# Patient Record
Sex: Male | Born: 1945 | Race: White | Hispanic: No | Smoking: Former smoker
Health system: Southern US, Community
[De-identification: ages and names within clinical notes are randomized; demographics above are authoritative.]

## PROBLEM LIST (undated history)

## (undated) DIAGNOSIS — I10 Essential (primary) hypertension: Secondary | ICD-10-CM

## (undated) DIAGNOSIS — F419 Anxiety disorder, unspecified: Secondary | ICD-10-CM

## (undated) DIAGNOSIS — Z9109 Other allergy status, other than to drugs and biological substances: Secondary | ICD-10-CM

## (undated) DIAGNOSIS — T7840XA Allergy, unspecified, initial encounter: Secondary | ICD-10-CM

## (undated) DIAGNOSIS — E7800 Pure hypercholesterolemia, unspecified: Secondary | ICD-10-CM

## (undated) DIAGNOSIS — Z86010 Personal history of colonic polyps: Secondary | ICD-10-CM

## (undated) DIAGNOSIS — J439 Emphysema, unspecified: Secondary | ICD-10-CM

## (undated) HISTORY — DX: Essential (primary) hypertension: I10

## (undated) HISTORY — DX: Allergy, unspecified, initial encounter: T78.40XA

## (undated) HISTORY — DX: Pure hypercholesterolemia, unspecified: E78.00

## (undated) HISTORY — DX: Emphysema, unspecified: J43.9

## (undated) HISTORY — PX: DOPPLER ECHOCARDIOGRAPHY: SHX263

## (undated) HISTORY — DX: Other allergy status, other than to drugs and biological substances: Z91.09

## (undated) HISTORY — DX: Personal history of colonic polyps: Z86.010

---

## 2007-07-22 ENCOUNTER — Encounter (INDEPENDENT_AMBULATORY_CARE_PROVIDER_SITE_OTHER): Payer: Self-pay | Admitting: General Surgery

## 2007-07-22 ENCOUNTER — Ambulatory Visit (HOSPITAL_COMMUNITY): Admission: RE | Admit: 2007-07-22 | Discharge: 2007-07-23 | Payer: Self-pay | Admitting: General Surgery

## 2008-04-15 HISTORY — PX: CHOLECYSTECTOMY: SHX55

## 2008-06-24 ENCOUNTER — Ambulatory Visit: Payer: Self-pay | Admitting: Internal Medicine

## 2008-07-12 ENCOUNTER — Encounter: Payer: Self-pay | Admitting: Internal Medicine

## 2008-07-12 ENCOUNTER — Ambulatory Visit: Payer: Self-pay | Admitting: Internal Medicine

## 2008-07-12 DIAGNOSIS — Z860100 Personal history of colon polyps, unspecified: Secondary | ICD-10-CM | POA: Insufficient documentation

## 2008-07-12 HISTORY — DX: Personal history of colonic polyps: Z86.010

## 2008-07-14 ENCOUNTER — Encounter: Payer: Self-pay | Admitting: Internal Medicine

## 2009-03-02 IMAGING — RF DG CHOLANGIOGRAM OPERATIVE
1 series · 23 of 24 positions shown · non-contrast
Comparison: None

CLINICAL DATA: Gallstones.

INTRAOPERATIVE CHOLANGIOGRAM
TECHNIQUE: Multiple fluoroscopic spot radiographs were obtained
during intraoperative cholangiogram and are submitted for
interpretation post-operatively.

[Series 1: run · 23 of 67 slices shown]
[im 1/67]
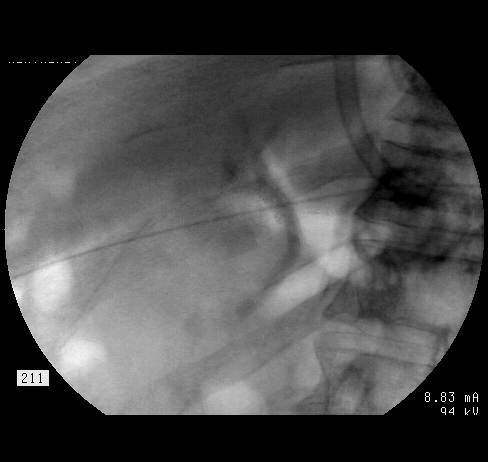
[im 3/67]
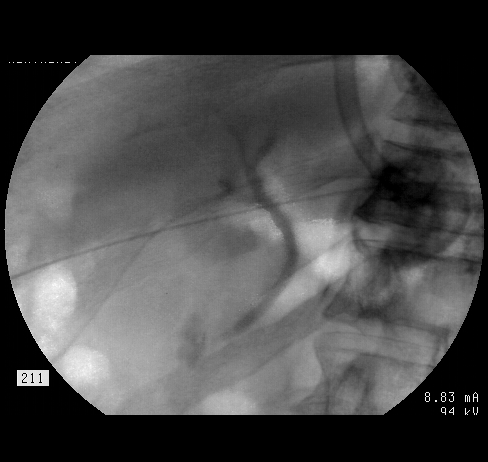
[im 6/67]
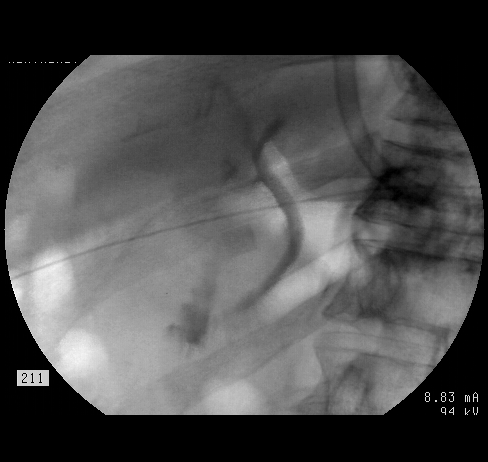
[im 9/67]
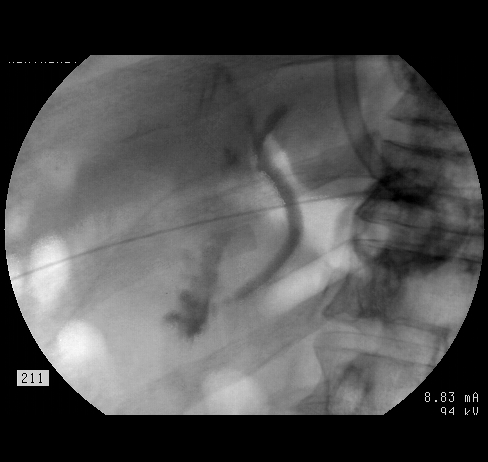
[im 12/67]
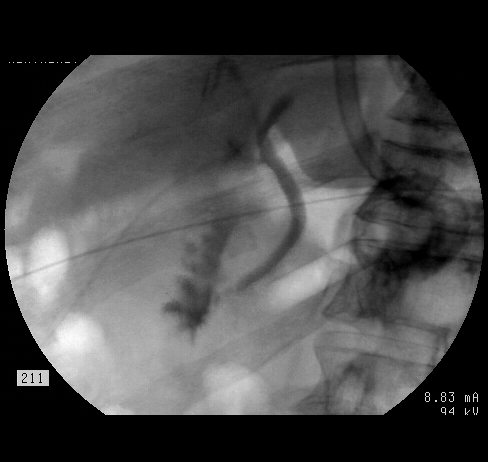
[im 15/67]
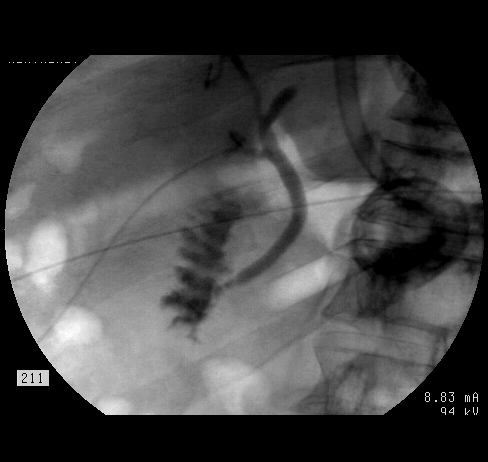
[im 18/67]
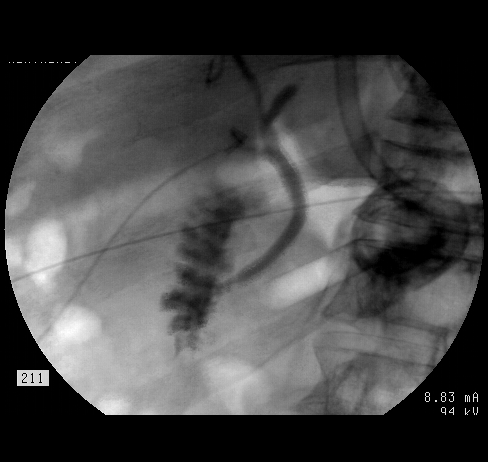
[im 21/67]
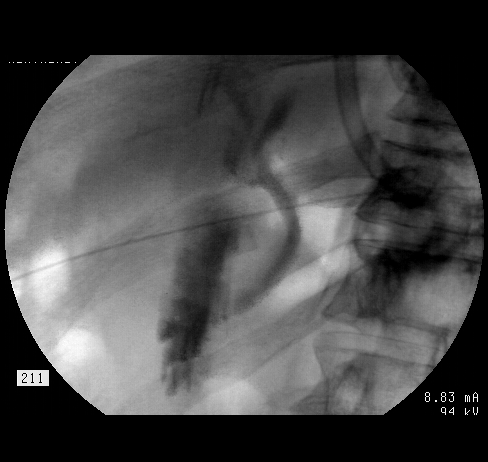
[im 23/67]
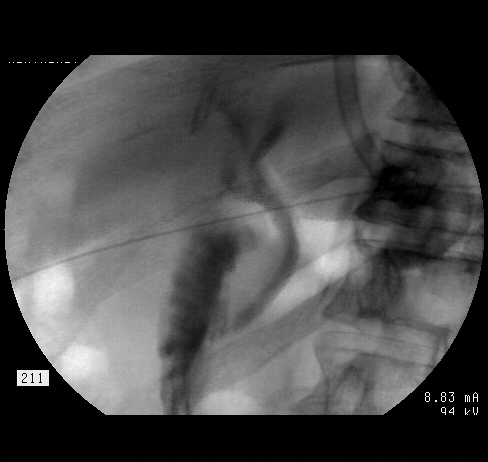
[im 26/67]
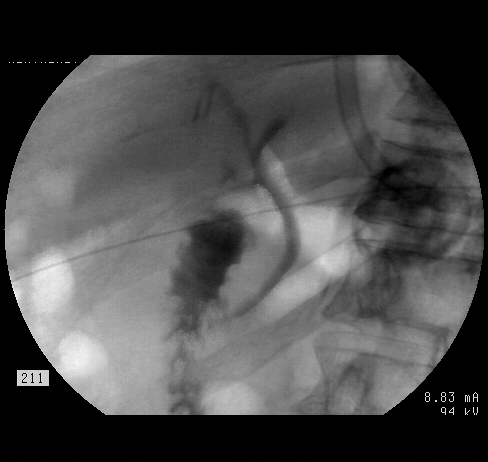
[im 29/67]
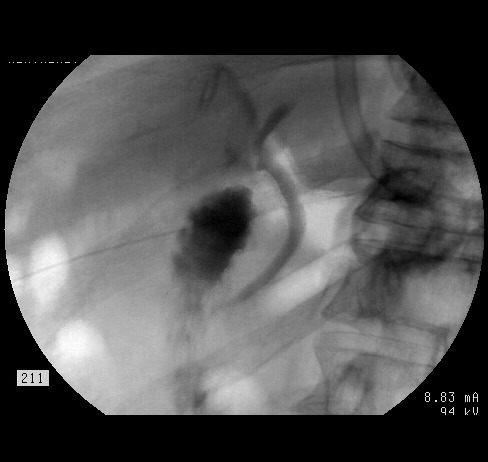
[im 35/67]
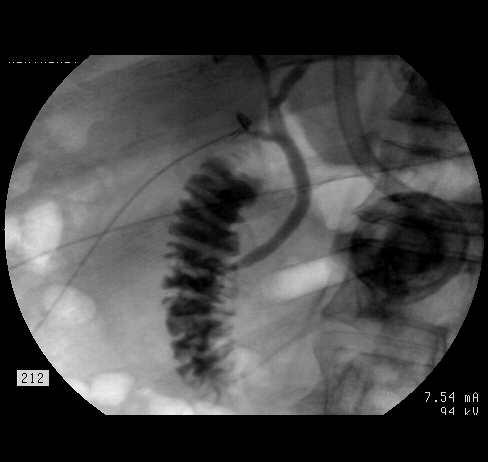
[im 38/67]
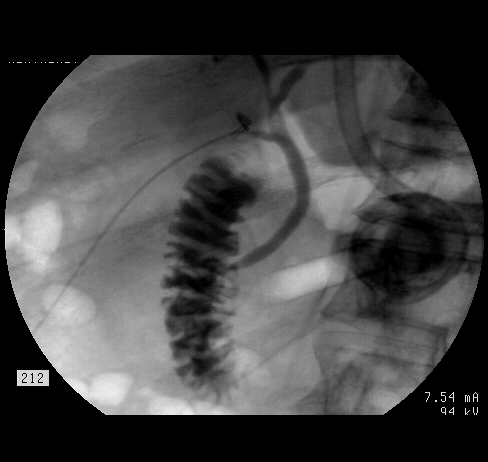
[im 41/67]
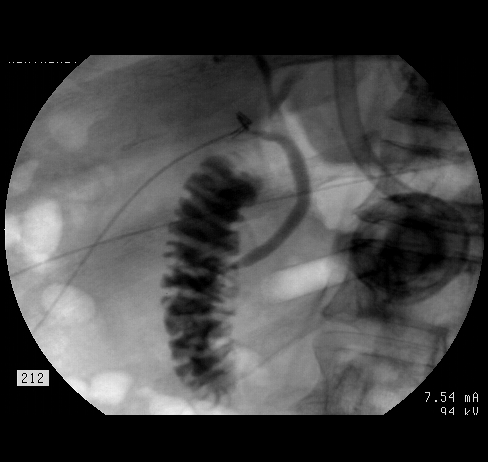
[im 44/67]
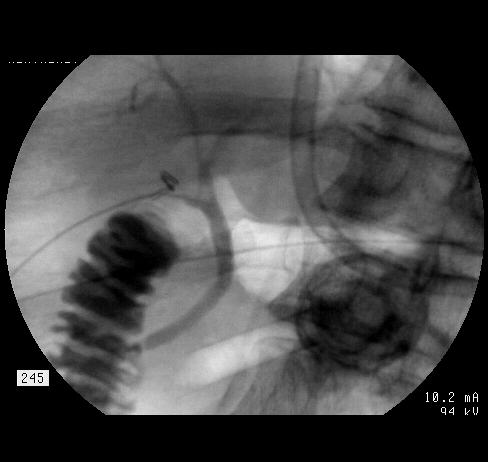
[im 46/67]
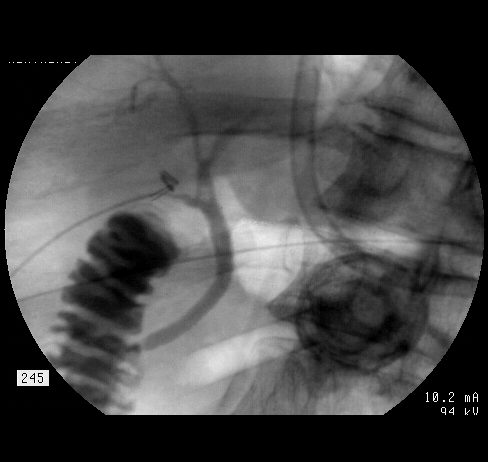
[im 49/67]
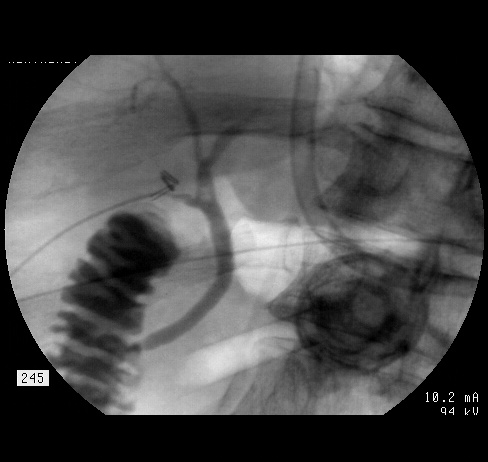
[im 52/67]
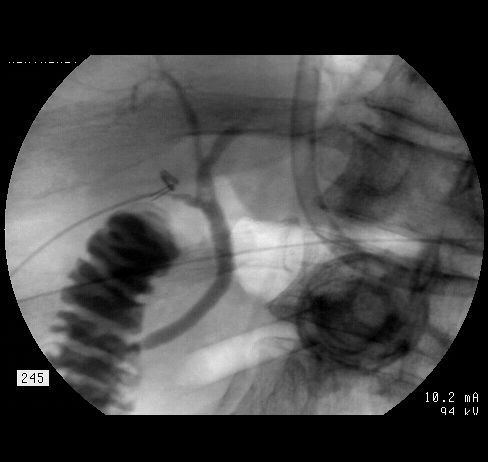
[im 55/67]
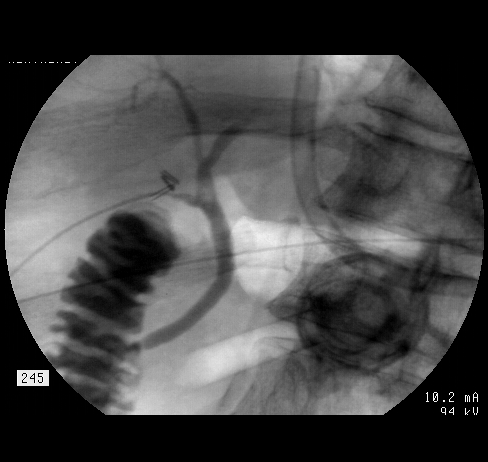
[im 58/67]
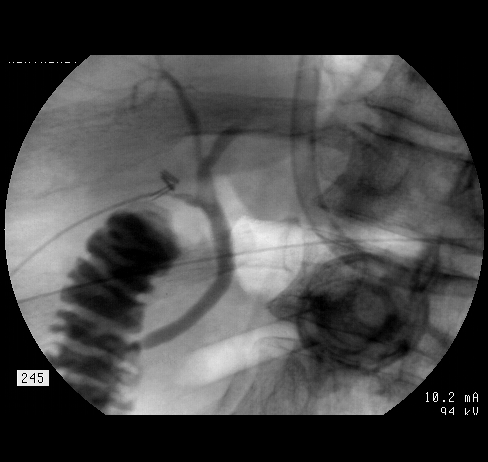
[im 61/67]
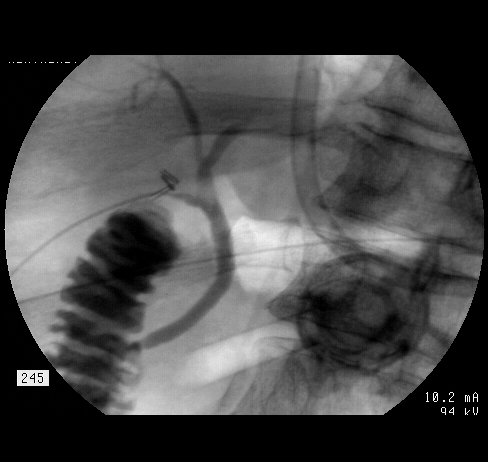
[im 64/67]
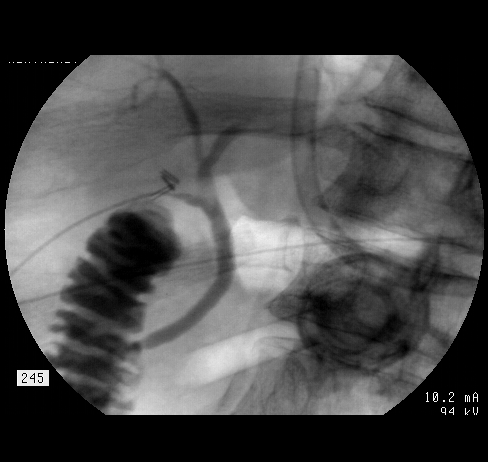
[im 67/67]
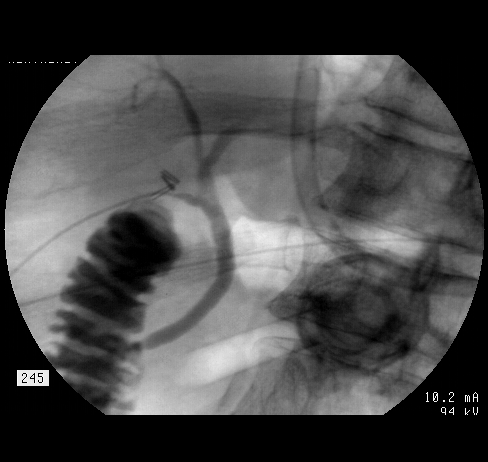

[23 of 24 positions shown; findings below may reference images not displayed]

FINDINGS: A single run of 67 images is submitted.  The exam is
moderate motion limited.  There is no evidence of contrast
extravasation.  Contrast empties into the descending duodenum.
Filling defects within the distal common hepatic duct just superior
to the cystic duct.  Favor these being air bubbles, given the
change in morphology on the final five images of study.
IMPRESSION: 1.  No evidence of contrast extravasation.
2.  Favor gas bubbles in the distal common hepatic duct.  If
bilirubin and becomes elevated, further evaluation with MRCP or
ERCP should be considered to exclude common duct stone.
3.  Moderately motion degraded exam.

## 2010-08-28 NOTE — Op Note (Signed)
NAME:  Joseph Green, Joseph Green                ACCOUNT NO.:  [id number]   MEDICAL RECORD NO.:  [id number]          PATIENT TYPE:  AMB   LOCATION:  DAY                          FACILITY:  [facility]   PHYSICIAN:  Anselm Pancoast. Weatherly, M.D.DATE OF BIRTH:  01-30-1946   DATE OF PROCEDURE:  07/22/2007  DATE OF DISCHARGE:                               OPERATIVE REPORT   PREOPERATIVE DIAGNOSES:  Chronic cholecystitis with stones.   POSTOPERATIVE DIAGNOSES:  Subacute cholecystitis with stones.   OPERATION:  Laparoscopic cholecystectomy with cholangiogram.   ANESTHESIA:  General.   HISTORY:  Joseph Green is a 65 year old slightly overweight Caucasian  male who was referred to me with problems with intermittent epigastric  pain, felt secondary to gallstones.  He had, had an ultrasound that had  confirmed stones.  Dr. Guerry Bruin is his physician, but he was  referred actually to Dr. Louanna Raw, who had seen him when he would  have these episodes.  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  He also is having problems with high  blood pressure, and he was given an appointment to see Dr. Wylene Simmer at  [facility] for management of his blood pressure.  The patient  admitted to only one severe episode of pain, but stated he had some  discomfort.  He does not smoke and has no allergies.  His weight is  about 230 pounds. The patient preoperatively had repeat liver function  studies and an EKG and PTT were normal.  His liver function studies were  normal.  Total bilirubin 0.9, alkaline phosphatase 75, SGOT and SGPT 21  and 18 respectively.  White count was 7200 with a hematocrit of 42.8.   OPERATIVE PROCEDURE:  The patient preoperatively was given 3 gm of Unasyn.  He had PAS  stockings and taken back to the operative suite.  The abdomen after  induction of general anesthesia and endotracheal tube was prepped with  Betadine solution.  We kind of clipped around  the umbilicus in the  subxiphoid area, and then the patient was draped in a sterile manner.  A  small incision was made below the umbilicus,  about 2 cm of adipose  tissue and the fascia identified.  This was kind of carefully opened and  then two Kochers were placed on the fascia edges and very carefully  entered into the peritoneal cavity with the [employer].  The pursestring  suture of Vicryl was placed and the Hassan cannula introduced.  The  gallbladder was subacutely inflamed, it was quite edematous with omentum  adherent to it.  The upper 10 mm trocar was placed in the subxiphoid  area, and the patient was placed in a pretty steep reversed position and  switched over to the right.  Five mm trocars were placed after  anesthetizing the fascia, and the omentum was peeled off of the inflamed  gallbladder.  You really could not grasp the gallbladder with the  graspers because it was so tense and edematous, and I went ahead and  aspirated the bile, but the __________  was not aspirated and the bile  was not very bilious.  Obviously, his gallbladder has not been working.  With the gallbladder depressed, I could then grasp it with the grasper  and then dissecting in the proximal portion, it was obvious that we were  going to need the 30 degree scope, and we switched for the 30 degree  scope.  We then carefully opened the peritoneum.  At the proximal  portion of the gallbladder, you could see a large stone impacted in the  neck of the gallbladder.  And then we kind of carefully teased and freed  up everything, separating the sentinel lymph node.  Then I could get  around the cystic duct that was about 2-2.5 cm in length.  I then placed  a clip flush with the junction of the cystic duct/gallbladder and then  made a little opening in the cystic duct proximally and introduced a  Cook catheter that was held in place with a clip, and then an x-ray was  obtained.  There was good prompt fill of the  extrahepatic biliary system  with good flow into the duodenum.  It looked like there was possibly a  little air bubble that had gone up into the hepatic duct.  We placed him  in a real steep Trendelenburg and did a repeat injection, and we could  see that it looked like there were two little air bubbles that kind of  would change their characteristics.  Later, it was identified that he  had two large stones in his gallbladder, so I think that the little  question of the bile duct on the cholangiogram was most likely a small  air bubble.  We placed him back in an operative position, reinflated the  carbon dioxide and then removed catheter and put three clips on the  cystic duct.  I then divided it and then very carefully teased out and  identified what, I am  sure was the anterior branch of the cystic artery  and also the posterior branch which were individually clipped, divided  and with first __________ and hook and then switching it to the spatula,  the inflamed gallbladder was kind of carefully separated from its bed.  There was several little areas that were lightly coagulated.  Good  hemostasis.  And then after the gallbladder was free, it was placed in  an EndoCatch bag.  The camera was switched to the upper 10 mm port and  the bag containing the gallbladder and stones were withdrawn without  enlarging the fascia any more.  I put an additional figure-of-eight  suture in the fascia below the umbilicus and an additional pursestring  of 0 Vicryl.  I reinspected everything, irrigated, aspirated, and there  was no evidence of any bile or bleeding.  All the bile stained fluid was  withdrawn.  I then withdrew the 5 mL ports under direct vision,  anesthetized all the port sites with Marcaine with adrenaline and then  released the carbon dioxide.  I withdrew the upper 10 mm port.  I did  place a figure-of-eight of 0 Vicryl in the fascia in the subxiphoid area  also.  And then the subcutaneous  wounds were closed with 4-0 Vicryl with  Benzoin and Steri-Strips on the skin.  The patient tolerated the  procedure nicely and should be able to be discharged tomorrow.  He lives  alone and arrangements will be made for his children to picked him up.  ______________________________  Anselm Pancoast. Zachery Dakins, M.D.     WJW/MEDQ  D:  07/22/2007  T:  07/22/2007  Job:  [id number]   cc:   Louanna Raw  Fax: [phone]   Gaspar Garbe, M.D.  Fax: [phone]

## 2011-01-08 LAB — COMPREHENSIVE METABOLIC PANEL
ALT: 18
AST: 21
Albumin: 3.5
Alkaline Phosphatase: 75
BUN: 8
Chloride: 103
GFR calc Af Amer: >60
Potassium: 4.2
Sodium: 139
Total Bilirubin: 0.9
Total Protein: 6.4

## 2011-01-08 LAB — DIFFERENTIAL
Basophils Absolute: 0.1
Lymphocytes Relative: 34
Monocytes Absolute: 0.5
Neutro Abs: 4.1
Neutrophils Relative %: 57

## 2011-01-08 LAB — CBC
Hemoglobin: 15.3
RDW: 14.2

## 2012-05-07 ENCOUNTER — Other Ambulatory Visit: Payer: Self-pay | Admitting: Dermatology

## 2013-07-22 ENCOUNTER — Encounter: Payer: Self-pay | Admitting: Internal Medicine

## 2013-10-18 ENCOUNTER — Encounter: Payer: Self-pay | Admitting: Internal Medicine

## 2013-12-27 ENCOUNTER — Encounter: Payer: Self-pay | Admitting: Internal Medicine

## 2013-12-31 ENCOUNTER — Encounter: Payer: Self-pay | Admitting: Internal Medicine

## 2014-01-12 ENCOUNTER — Encounter: Payer: Self-pay | Admitting: Internal Medicine

## 2014-07-12 ENCOUNTER — Encounter: Payer: Self-pay | Admitting: Internal Medicine

## 2014-09-27 ENCOUNTER — Encounter: Payer: Self-pay | Admitting: Internal Medicine

## 2014-11-10 ENCOUNTER — Encounter: Payer: Self-pay | Admitting: Internal Medicine

## 2015-03-03 ENCOUNTER — Encounter: Payer: Self-pay | Admitting: Internal Medicine

## 2015-04-05 ENCOUNTER — Ambulatory Visit (AMBULATORY_SURGERY_CENTER): Payer: Self-pay

## 2015-04-05 VITALS — Ht 74.0 in | Wt 237.4 lb

## 2015-04-05 DIAGNOSIS — Z860100 Personal history of colon polyps, unspecified: Secondary | ICD-10-CM

## 2015-04-05 NOTE — Progress Notes (Signed)
No allergies to eggs or soy No past problems with anesthesia No home oxygen No diet/weight loss meds  No internet; has had test before

## 2015-04-19 ENCOUNTER — Encounter: Payer: Self-pay | Admitting: Internal Medicine

## 2015-04-19 ENCOUNTER — Ambulatory Visit (AMBULATORY_SURGERY_CENTER): Payer: Medicare Other | Admitting: Internal Medicine

## 2015-04-19 VITALS — BP 136/73 | HR 74 | Temp 98.1°F | Resp 23 | Ht 74.0 in | Wt 237.0 lb

## 2015-04-19 DIAGNOSIS — Z86010 Personal history of colonic polyps: Secondary | ICD-10-CM | POA: Diagnosis present

## 2015-04-19 DIAGNOSIS — D124 Benign neoplasm of descending colon: Secondary | ICD-10-CM | POA: Diagnosis not present

## 2015-04-19 DIAGNOSIS — D123 Benign neoplasm of transverse colon: Secondary | ICD-10-CM

## 2015-04-19 HISTORY — PX: COLONOSCOPY W/ POLYPECTOMY: SHX1380

## 2015-04-19 MED ORDER — SODIUM CHLORIDE 0.9 % IV SOLN: 500.0000 mL | INTRAVENOUS | Status: DC

## 2015-04-19 NOTE — Patient Instructions (Addendum)
I found and removed 3 small polyps. You also have a condition called diverticulosis - common and not usually a problem. Please read the handout provided.   I will let you know pathology results and when to have another routine colonoscopy by mail.  I appreciate the opportunity to care for you. Gatha Mayer, MD, FACG  YOU HAD AN ENDOSCOPIC PROCEDURE TODAY AT [facility]:   Refer to the procedure report that was given to you for any specific questions about what was found during the examination.  If the procedure report does not answer your questions, please call your gastroenterologist to clarify.  If you requested that your care partner not be given the details of your procedure findings, then the procedure report has been included in a sealed envelope for you to review at your convenience later.  YOU SHOULD EXPECT: Some feelings of bloating in the abdomen. Passage of more gas than usual.  Walking can help get rid of the air that was put into your GI tract during the procedure and reduce the bloating. If you had a lower endoscopy (such as a colonoscopy or flexible sigmoidoscopy) you may notice spotting of blood in your stool or on the toilet paper. If you underwent a bowel prep for your procedure, you may not have a normal bowel movement for a few days.  Please Note:  You might notice some irritation and congestion in your nose or some drainage.  This is from the oxygen used during your procedure.  There is no need for concern and it should clear up in a day or so.  SYMPTOMS TO REPORT IMMEDIATELY:   Following lower endoscopy (colonoscopy or flexible sigmoidoscopy):  Excessive amounts of blood in the stool  Significant tenderness or worsening of abdominal pains  Swelling of the abdomen that is new, acute  Fever of 100F or higher   Following upper endoscopy (EGD)  Vomiting of blood or coffee ground material  New chest pain or pain under the shoulder  blades  Painful or persistently difficult swallowing  New shortness of breath  Fever of 100F or higher  Black, tarry-looking stools  For urgent or emergent issues, a gastroenterologist can be reached at any hour by calling [phone].   DIET: Your first meal following the procedure should be a small meal and then it is ok to progress to your normal diet. Heavy or fried foods are harder to digest and may make you feel nauseous or bloated.  Likewise, meals heavy in dairy and vegetables can increase bloating.  Drink plenty of fluids but you should avoid alcoholic beverages for 24 hours.  ACTIVITY:  You should plan to take it easy for the rest of today and you should NOT DRIVE or use heavy machinery until tomorrow (because of the sedation medicines used during the test).    FOLLOW UP: Our staff will call the number listed on your records the next business day following your procedure to check on you and address any questions or concerns that you may have regarding the information given to you following your procedure. If we do not reach you, we will leave a message.  However, if you are feeling well and you are not experiencing any problems, there is no need to return our call.  We will assume that you have returned to your regular daily activities without incident.  If any biopsies were taken you will be contacted by phone or by letter within the next 1-3  weeks.  Please call us at [phone] if you have not heard about the biopsies in 3 weeks.    SIGNATURES/CONFIDENTIALITY: You and/or your care partner have signed paperwork which will be entered into your electronic medical record.  These signatures attest to the fact that that the information above on your After Visit Summary has been reviewed and is understood.  Full responsibility of the confidentiality of this discharge information lies with you and/or your care-partner.  Polyp and diverticulosis information given.

## 2015-04-19 NOTE — Progress Notes (Signed)
Called to room to assist during endoscopic procedure.  Patient ID and intended procedure confirmed with present staff. Received instructions for my participation in the procedure from the performing physician.  

## 2015-04-19 NOTE — Op Note (Signed)
[place]  [address] [place], [id number]   COLONOSCOPY PROCEDURE REPORT  PATIENT: Joseph Green, Joseph Green  MR#: [id number] BIRTHDATE: 06/11/1945 , 69  yrs. old GENDER: male ENDOSCOPIST: Gatha Mayer, MD, [facility] PROCEDURE DATE:  04/19/2015 PROCEDURE:   Colonoscopy, surveillance , Colonoscopy with biopsy, and Colonoscopy with snare polypectomy First Screening Colonoscopy - Avg.  risk and is 50 yrs.  old or older - No.  Prior Negative Screening - Now for repeat screening. N/A  History of Adenoma - Now for follow-up colonoscopy & has been > or = to 3 yrs.  Yes hx of adenoma.  Has been 3 or more years since last colonoscopy.  Polyps removed today? Yes ASA CLASS:   Class III INDICATIONS:Surveillance due to prior colonic neoplasia and PH Colon Adenoma. MEDICATIONS: Propofol 250 mg IV and Monitored anesthesia care  DESCRIPTION OF PROCEDURE:   After the risks benefits and alternatives of the procedure were thoroughly explained, informed consent was obtained.  The digital rectal exam revealed no abnormalities of the rectum and revealed the prostate was not enlarged.   The [id number]  endoscope was introduced through the anus and advanced to the cecum, which was identified by both the appendix and ileocecal valve. No adverse events experienced.   The quality of the prep was adequate  The instrument was then slowly withdrawn as the colon was fully examined. Estimated blood loss is zero unless otherwise noted in this procedure report.  COLON FINDINGS: Three polypoid shaped sessile polyps ranging from 2 to 61mm in size were found in the transverse colon and descending colon.  Polypectomies were performed with cold forceps and with a cold snare.  The resection was complete, the polyp tissue was completely retrieved and sent to histology.   There was diverticulosis noted in the left colon.   The examination was otherwise normal.  Retroflexed views revealed no  abnormalities. The time to cecum = 4.1 Withdrawal time = 13.9   The scope was withdrawn and the procedure completed. COMPLICATIONS: There were no immediate complications.  ENDOSCOPIC IMPRESSION: 1.   Three sessile polyps ranging from 2 to 21mm in size were found in the transverse colon and descending colon; polypectomies were performed with cold forceps and with a cold snare 2.   Diverticulosis was noted in the left colon 3.   The examination was otherwise normal - hx 2 small adenomas 2010  RECOMMENDATIONS: Timing of repeat colonoscopy will be determined by pathology findings.  eSigned:  Gatha Mayer, MD, [facility] 04/19/2015 4:48 PM   cc: Dr. Anastasia Pall and The Patient

## 2015-04-20 ENCOUNTER — Telehealth: Payer: Self-pay

## 2015-04-20 NOTE — Telephone Encounter (Signed)
  Follow up Call-  Call back number 04/19/2015  Post procedure Call Back phone  # [phone]  Permission to leave phone message Yes     Patient questions:  Do you have a fever, pain , or abdominal swelling? No. Pain Score  0 *  Have you tolerated food without any problems? Yes.    Have you been able to return to your normal activities? Yes.    Do you have any questions about your discharge instructions: Diet   No. Medications  No. Follow up visit  No.  Do you have questions or concerns about your Care? No.  Actions: * If pain score is 4 or above: No action needed, pain <4.

## 2015-04-26 ENCOUNTER — Encounter: Payer: Self-pay | Admitting: Internal Medicine

## 2015-04-26 DIAGNOSIS — Z86010 Personal history of colonic polyps: Secondary | ICD-10-CM

## 2015-04-26 NOTE — Progress Notes (Signed)
Quick Note:  3 small adenomas Repeat colonoscopy 2020 ______

## 2015-11-14 ENCOUNTER — Other Ambulatory Visit (INDEPENDENT_AMBULATORY_CARE_PROVIDER_SITE_OTHER): Payer: Medicare Other

## 2015-11-14 ENCOUNTER — Ambulatory Visit (INDEPENDENT_AMBULATORY_CARE_PROVIDER_SITE_OTHER): Payer: Medicare Other | Admitting: Pulmonary Disease

## 2015-11-14 ENCOUNTER — Encounter: Payer: Self-pay | Admitting: Pulmonary Disease

## 2015-11-14 VITALS — BP 136/70 | HR 75 | Ht 74.0 in | Wt 239.6 lb

## 2015-11-14 DIAGNOSIS — J849 Interstitial pulmonary disease, unspecified: Secondary | ICD-10-CM | POA: Diagnosis not present

## 2015-11-14 DIAGNOSIS — J309 Allergic rhinitis, unspecified: Secondary | ICD-10-CM | POA: Insufficient documentation

## 2015-11-14 DIAGNOSIS — J8409 Other alveolar and parieto-alveolar conditions: Secondary | ICD-10-CM

## 2015-11-14 LAB — SEDIMENTATION RATE: Sed Rate: 8 mm/hr (ref 0–20)

## 2015-11-14 NOTE — Assessment & Plan Note (Signed)
Use Neil Med rinses with distilled water at least twice per day using the instructions on the package. 1/2 hour after using the Neil Med rinse, use Nasacort two puffs in each nostril once per day.  Remember that the Nasacort can take 1-2 weeks to work after regular use. Use generic zyrtec (cetirizine) every day.  If this doesn't help, then stop taking it and use chlorpheniramine-phenylephrine combination tablets.   

## 2015-11-14 NOTE — Patient Instructions (Signed)
For your sinus congestion: Use Neil Med rinses with distilled water at least twice per day using the instructions on the package. 1/2 hour after using the [facility] Med rinse, use Nasacort two puffs in each nostril once per day.  Remember that the Nasacort can take 1-2 weeks to work after regular use. Use generic zyrtec (cetirizine) every day.  If this doesn't help, then stop taking it and use chlorpheniramine-phenylephrine combination tablets.  For your abnormal CT scan ("lung scarring"): We will arrange a lung function test and a 6 minute walk We will draw blood today and call you with the results  We will see you back in 6 weeks

## 2015-11-14 NOTE — Assessment & Plan Note (Signed)
I have personally reviewed the images from It from February 2017 high-resolution CT scan as well as from June 2017. Both show groundglass with mild fibrotic changes in an upper lobe predominant distribution, perhaps some mild emphysema in the right upper lobe really nonsignificant. The lower lobes are near-normal.  The differential diagnosis here is broad. This pattern is not consistent with usual interstitial pneumonitis however. Given his lack of symptoms I'm hopeful that this is a burn out form of nonspecific interstitial pneumonitis, or a chronic form of hypersensitivity pneumonitis. The only way to know for certain would be to perform an open lung biopsy but given his lack of shortness of breath I think that's unnecessary right now.   Plan: Given the possibility that this could be a condition which may progress, he needs to be followed with serial pulmonary function testing. We will also perform serology testing to look for evidence of hypersensitivity pneumonitis, less likely vasculitis with his sinus symptoms, and connective tissue disease.

## 2015-11-14 NOTE — Progress Notes (Signed)
Subjective:    Patient ID: Joseph Green, male    DOB: 1945/07/20, 70 y.o.   MRN: [id number]  HPI Chief Complaint  Patient presents with  . Advice Only    referred by abn ct chest, emphysema by Dr. Melford Aase.     Joseph Green was referred from Dr. Melford Aase for an abnormal CT scan of the chest.  He says that he believes that he has emphysema.  He started smoking in 1967 when he was in the service.  He smoked less than 1 pack per day for 41 year, currently smoking 1/2 pack per day.  He notes that he has been feeling sinus congestion for a number of months, this has been going on for several months now.  The sinus congestion will drain down into his throat and make him cough.  This will get better with saline sprays at night.  He does not take any allergy medicine for this.  He doesn't report skin allergies. No itchy eyes, but he will have a scratchy throat associated with worsening congestion.  He has not tried any anti histamine.  It sounds like the congestion and cough lead to a CT scan of his chest which was abnormal.   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  He was often exposed to diesel exhaust in the garage, but he never worked on [employer] directly.  He said that the buses would often run the bus in the garage.  He reports no shortness of breath.  He says that he paces himself in retirement and has a little less energy, but reports no significant dyspnea with work.  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  He notes that his house has had a minor amount of water damage around the chimney which he had repaired when he had his roof replaced about 6 months ago. He does not have a basement in his house. He does  not have any hobbies such as woodworking, his main hobby is bowling. He does not hunt. He has cats that live in the house but he reports no birds or dogs. He does not have a humidifier or a hot tub in the home.  He reports having dry mouth on a daily basis but no dry eyes. He has no arthritis symptoms for redness or swelling of any joints. He reports no trouble swallowing.   Past Medical History:  Diagnosis Date  . Environmental allergies   . Hypercholesteremia   . Hypertension   . Personal history of colonic polyps 07/12/2008     Family History  Problem Relation Age of Onset  . Heart disease Father   . Colon cancer Neg Hx      Social History   Social History  . Marital status: Single    Spouse name: N/A  . Number of children: N/A  . Years of education: N/A   Occupational History  . Not on file.   Social History Main Topics  . Smoking status: Current Every Day Smoker    Packs/day: 1.00    Years: 41.00    Types: Cigarettes  . Smokeless tobacco: Never Used     Comment: has  cut down 0.5ppd Xpast 5 years   . Alcohol use No  . Drug use: No  . Sexual activity: Not on file   Other Topics Concern  . Not on file   Social History Narrative  . No narrative on file     Allergies  Allergen Reactions  . Lipitor [Atorvastatin] Other (See Comments)    Body aches     Outpatient Medications Prior to Visit  Medication Sig Dispense Refill  . amLODipine (NORVASC) 10 MG tablet Take 10 mg by mouth daily.    Marland Kitchen aspirin 81 MG tablet Take 81 mg by mouth daily.    . fluticasone (FLONASE) 50 MCG/ACT nasal spray Place 1 spray into both nostrils daily.     Marland Kitchen OVER THE COUNTER MEDICATION Fish oil 2000mg     . rosuvastatin (CRESTOR) 5 MG tablet Take 5 mg by mouth at bedtime.     No facility-administered medications prior to visit.       Review of Systems  Constitutional: Negative for fever and unexpected weight change.  HENT: Positive for congestion, postnasal drip and sinus  pressure. Negative for dental problem, ear pain, nosebleeds, rhinorrhea, sneezing, sore throat and trouble swallowing.   Eyes: Negative for redness and itching.  Respiratory: Positive for cough and shortness of breath. Negative for chest tightness and wheezing.   Cardiovascular: Negative for palpitations and leg swelling.  Gastrointestinal: Negative for nausea and vomiting.  Genitourinary: Negative for dysuria.  Musculoskeletal: Negative for joint swelling.  Skin: Negative for rash.  Neurological: Negative for headaches.  Hematological: Does not bruise/bleed easily.  Psychiatric/Behavioral: Negative for dysphoric mood. The patient is not nervous/anxious.        Objective:   Physical Exam Vitals:   11/14/15 1636  BP: 136/70  Pulse: 75  SpO2: 97%  Weight: 239 lb 9.6 oz (108.7 kg)  Height: 6\' 2"  (1.88 m)   RA  Gen: well appearing, no acute distress HENT: NCAT, OP clear, neck supple without masses Eyes: PERRL, EOMi Lymph: no cervical lymphadenopathy PULM: CTA B CV: RRR, no mgr, no JVD GI: BS+, soft, nontender, no hsm Derm: no rash or skin breakdown MSK: normal bulk and tone Neuro: A&Ox4, CN II-XII intact, strength 5/5 in all 4 extremities Psyche: normal mood and affect   June 2017 CT chest images personally reviewed showing scattered groundglass and patchy distribution bilaterally, upper lobe predominant, there is some fibrotic change associated with this but no distinct emphysema based on my review.  Spring 2017 records from his primary care physician's office reviewed were he was referred to me for evaluation of his abnormal chest CT.     Assessment & Plan:  ILD (interstitial lung disease) ([place]) I have personally reviewed the images from It from February 2017 high-resolution CT scan as well as from June 2017. Both show groundglass with mild fibrotic changes in an upper lobe predominant distribution, perhaps some mild emphysema in the right upper lobe really  nonsignificant. The lower lobes are near-normal.  The differential diagnosis here is broad. This pattern is not consistent with usual interstitial pneumonitis however. Given his lack of symptoms I'm hopeful that this is a burn out form of nonspecific interstitial pneumonitis, or a chronic form of hypersensitivity pneumonitis. The only way to know for certain would be to perform an open lung biopsy but given his lack of shortness of breath I think that's unnecessary right now.   Plan: Given the possibility that this could be a condition which may progress, he needs to be  followed with serial pulmonary function testing. We will also perform serology testing to look for evidence of hypersensitivity pneumonitis, less likely vasculitis with his sinus symptoms, and connective tissue disease.  Allergic rhinitis Use Neil Med rinses with distilled water at least twice per day using the instructions on the package. 1/2 hour after using the [facility] Med rinse, use Nasacort two puffs in each nostril once per day.  Remember that the Nasacort can take 1-2 weeks to work after regular use. Use generic zyrtec (cetirizine) every day.  If this doesn't help, then stop taking it and use chlorpheniramine-phenylephrine combination tablets.      Current Outpatient Prescriptions:  .  amLODipine (NORVASC) 10 MG tablet, Take 10 mg by mouth daily., Disp: , Rfl:  .  aspirin 81 MG tablet, Take 81 mg by mouth daily., Disp: , Rfl:  .  fluticasone (FLONASE) 50 MCG/ACT nasal spray, Place 1 spray into both nostrils daily. , Disp: , Rfl:  .  OVER THE COUNTER MEDICATION, Fish oil 2000mg , Disp: , Rfl:  .  oxymetazoline (AFRIN) 0.05 % nasal spray, Place 1 spray into both nostrils at bedtime as needed for congestion. Pt takes [facility] brand, Disp: , Rfl:  .  rosuvastatin (CRESTOR) 5 MG tablet, Take 5 mg by mouth at bedtime., Disp: , Rfl:

## 2015-11-15 LAB — ANA: Anti Nuclear Antibody(ANA): NEGATIVE

## 2015-11-15 LAB — CYCLIC CITRUL PEPTIDE ANTIBODY, IGG: Cyclic Citrullin Peptide Ab: <16 Units

## 2015-11-15 LAB — C-REACTIVE PROTEIN: CRP: 0.4 mg/dL — ABNORMAL LOW (ref 0.5–20.0)

## 2015-11-15 LAB — SJOGRENS SYNDROME-A EXTRACTABLE NUCLEAR ANTIBODY: SSA (RO) (ENA) ANTIBODY, IGG: NEGATIVE

## 2015-11-15 LAB — ANTI-SCLERODERMA ANTIBODY: SCLERODERMA (SCL-70) (ENA) ANTIBODY, IGG: NEGATIVE

## 2015-11-15 LAB — RHEUMATOID FACTOR

## 2015-11-15 LAB — SJOGRENS SYNDROME-B EXTRACTABLE NUCLEAR ANTIBODY: SSB (LA) (ENA) ANTIBODY, IGG: NEGATIVE

## 2015-11-15 LAB — CENTROMERE ANTIBODIES: CENTROMERE AB SCREEN: NEGATIVE

## 2015-11-15 LAB — ANCA SCREEN W REFLEX TITER: ANCA Screen: NEGATIVE

## 2015-11-16 LAB — ALDOLASE: ALDOLASE: 6.8 U/L (ref <=8.1)

## 2015-11-17 LAB — HYPERSENSITIVITY PNEUMONITIS
A. PULLULANS ABS: NEGATIVE
A.Fumigatus #1 Abs: NEGATIVE
MICROPOLYSPORA FAENI IGG: NEGATIVE
Pigeon Serum Abs: NEGATIVE
THERMOACTINOMYCES VULGARIS IGG: NEGATIVE
Thermoact. Saccharii: NEGATIVE

## 2015-11-17 LAB — ANTI-JO 1 ANTIBODY, IGG

## 2015-11-20 ENCOUNTER — Telehealth: Payer: Self-pay | Admitting: Pulmonary Disease

## 2015-11-20 NOTE — Telephone Encounter (Signed)
Patient notified of lab results. Nothing further needed.  

## 2015-11-20 NOTE — Progress Notes (Signed)
LMOMTCB x 1 

## 2015-11-23 ENCOUNTER — Ambulatory Visit (HOSPITAL_COMMUNITY)
Admission: RE | Admit: 2015-11-23 | Discharge: 2015-11-23 | Disposition: A | Discharge status: Home / Self Care | Payer: Medicare Other | Source: Ambulatory Visit | Attending: Pulmonary Disease | Admitting: Pulmonary Disease

## 2015-11-23 DIAGNOSIS — J849 Interstitial pulmonary disease, unspecified: Secondary | ICD-10-CM | POA: Insufficient documentation

## 2015-11-23 LAB — PULMONARY FUNCTION TEST
DL/VA % pred: 69 %
DL/VA: 3.33 ml/min/mmHg/L
DLCO unc % pred: 54 %
DLCO unc: 20.45 ml/min/mmHg
FEF 25-75 PRE: 3 L/s
FEF 25-75 Post: 3.97 L/sec
FEF2575-%CHANGE-POST: 32 %
FEF2575-%PRED-PRE: 105 %
FEF2575-%Pred-Post: 138 %
FEV1-%Change-Post: 7 %
FEV1-%PRED-POST: 87 %
FEV1-%Pred-Pre: 82 %
FEV1-PRE: 3.1 L
FEV1-Post: 3.33 L
FEV1FVC-%CHANGE-POST: 0 %
FEV1FVC-%Pred-Pre: 105 %
FEV6-%CHANGE-POST: 6 %
FEV6-%PRED-POST: 86 %
FEV6-%PRED-PRE: 80 %
FEV6-Post: 4.18 L
FEV6-Pre: 3.91 L
FEV6FVC-%CHANGE-POST: 0 %
FEV6FVC-%Pred-Post: 103 %
FEV6FVC-%Pred-Pre: 103 %
FVC-%CHANGE-POST: 6 %
FVC-%PRED-POST: 83 %
FVC-%Pred-Pre: 77 %
FVC-Post: 4.25 L
FVC-Pre: 3.98 L
POST FEV1/FVC RATIO: 78 %
POST FEV6/FVC RATIO: 98 %
PRE FEV1/FVC RATIO: 78 %
Pre FEV6/FVC Ratio: 99 %

## 2015-11-23 MED ORDER — ALBUTEROL SULFATE (2.5 MG/3ML) 0.083% IN NEBU
2.5000 mg | INHALATION_SOLUTION | Freq: Once | RESPIRATORY_TRACT | Status: AC
Start: 2015-11-23 — End: 2015-11-23
  Administered 2015-11-23: 2.5 mg via RESPIRATORY_TRACT

## 2015-12-26 ENCOUNTER — Ambulatory Visit (INDEPENDENT_AMBULATORY_CARE_PROVIDER_SITE_OTHER): Payer: Medicare Other | Admitting: Pulmonary Disease

## 2015-12-26 ENCOUNTER — Encounter: Payer: Self-pay | Admitting: Pulmonary Disease

## 2015-12-26 DIAGNOSIS — R0609 Other forms of dyspnea: Secondary | ICD-10-CM

## 2015-12-26 DIAGNOSIS — J301 Allergic rhinitis due to pollen: Secondary | ICD-10-CM | POA: Diagnosis not present

## 2015-12-26 DIAGNOSIS — J849 Interstitial pulmonary disease, unspecified: Secondary | ICD-10-CM | POA: Diagnosis not present

## 2015-12-26 DIAGNOSIS — Z23 Encounter for immunization: Secondary | ICD-10-CM

## 2015-12-26 NOTE — Assessment & Plan Note (Signed)
Symptoms are greatly improved with nasal steroid and over-the-counter second-generation antihistamine.  I encouraged him to use these in the spring and the summer when his allergic rhinitis is worse.  He's also been using an Afrin-type nasal spray for years. I advised him to try to slowly wean himself off of this and replace it with saline rinses.  > 50% of today's visit spent face to face, 27 minute visit

## 2015-12-26 NOTE — Progress Notes (Signed)
   Subjective:    Patient ID: Joseph Green, male    DOB: 10/17/1945, 70 y.o.   MRN: [id number]  Synopsis: Referred in 2017 for evaluation of an underlying interstitial lung disease. June 2017 CT chest images personally reviewed showing scattered groundglass and patchy distribution bilaterally, upper lobe predominant, there is some fibrotic change associated with this but no distinct emphysema based on my review.  Spring 2017 records from his primary care physician's office reviewed were he was referred to me for evaluation of his abnormal chest CT. 10/2015 aldolase, crp, ana, scl-70, ssa/ssb, ccp, Jo-1, hypersensitivity pneumonitis all negative  August 2017 pulmonary function testing normal ratio, FEV1 3.33 L, 87% predicted, FVC 4.25 L, 83% predicted, DLCO 20.4, 54% predicted, total lung capacity not reported.  September 2017 6 minute walk 396.5 m, O2 saturation nadir 95% on room air  HPI Chief Complaint  Patient presents with  . Follow-up    Review 50mw.  Pt c/o occasional sinus congestion, prod cough, but states he is doing very well.      Joseph Green has been doing well.  His lung function test and his 6 min walk went wel.Marland Kitchen  He reports not having any shortness of breath or any breathing problems at all.  No cough.  He remains quite active.  His sinus symptoms have really improved with the sinus pill and the sinus spray.  Minimal sinus congestion now.  His symptoms are typically worse in the summer. He has used Afrin for years.    Past Medical History:  Diagnosis Date  . Environmental allergies   . Hypercholesteremia   . Hypertension   . Personal history of colonic polyps 07/12/2008      Review of Systems     Objective:   Physical Exam Vitals:   12/26/15 1037  BP: (!) 150/82  Pulse: 92  SpO2: 96%  Weight: 239 lb 9.6 oz (108.7 kg)  Height: 6\' 2"  (1.88 m)   RA  Gen: well appearing HENT: OP clear, TM's clear, neck supple PULM: CTA B, normal percussion CV: RRR, no mgr,  trace edema GI: BS+, soft, nontender Derm: no cyanosis or rash Psyche: normal mood and affect        Assessment & Plan:  No problem-specific Assessment & Plan notes found for this encounter.    Current Outpatient Prescriptions:  .  amLODipine (NORVASC) 10 MG tablet, Take 10 mg by mouth daily., Disp: , Rfl:  .  aspirin 81 MG tablet, Take 81 mg by mouth daily., Disp: , Rfl:  .  fluticasone (FLONASE) 50 MCG/ACT nasal spray, Place 1 spray into both nostrils daily. , Disp: , Rfl:  .  OVER THE COUNTER MEDICATION, Fish oil 2000mg , Disp: , Rfl:  .  oxymetazoline (AFRIN) 0.05 % nasal spray, Place 1 spray into both nostrils at bedtime as needed for congestion. Pt takes [facility] brand, Disp: , Rfl:  .  rosuvastatin (CRESTOR) 5 MG tablet, Take 5 mg by mouth at bedtime., Disp: , Rfl:

## 2015-12-26 NOTE — Patient Instructions (Signed)
We will arrange for a lung function test in 6 minute walk in February and see you after that I recommend that she slowly wean yourself off of the Afrin nasal spray in the evenings and replace it with saline spray You can stop taking your sinus medicines (allergy medicines) after October 31, but then you'll need to resume them in the spring.

## 2015-12-26 NOTE — Assessment & Plan Note (Signed)
It's not clear what form of interstitial lung disease how has, but fortunately it has not caused him any problems in 2017. Also, his lung function testing was essentially normal with the exception of a mild decrease in his diffusion capacity.  His blood work did not yield evidence of an underlying systemic lung disease that would explain these findings.  Again, my hope is that this just represents scarring from an old acute illness. The ulna way to know for certain what's happening here would be to perform an open lung biopsy which is unnecessary considering his lack of symptoms and normal 6 minute walk and pulmonary function testing.  Plan: Repeat 6 minute walk and PFT in February 2018 and follow-up with me, if evidence of worsening then he may need to have a biopsy

## 2016-04-30 ENCOUNTER — Encounter: Payer: Self-pay | Admitting: Pulmonary Disease

## 2016-05-02 ENCOUNTER — Ambulatory Visit: Payer: Medicare Other

## 2016-05-02 ENCOUNTER — Ambulatory Visit: Payer: Medicare Other | Admitting: Pulmonary Disease

## 2016-05-31 NOTE — Addendum Note (Signed)
Addended by: Lorane Gell on: 05/31/2016 05:26 PM   Modules accepted: Orders

## 2016-06-03 ENCOUNTER — Ambulatory Visit (INDEPENDENT_AMBULATORY_CARE_PROVIDER_SITE_OTHER): Payer: Medicare Other | Admitting: Pulmonary Disease

## 2016-06-03 DIAGNOSIS — J849 Interstitial pulmonary disease, unspecified: Secondary | ICD-10-CM

## 2016-06-03 LAB — PULMONARY FUNCTION TEST
DL/VA % pred: 78 %
DL/VA: 3.75 ml/min/mmHg/L
DLCO COR % PRED: 56 %
DLCO UNC: 21.37 ml/min/mmHg
DLCO cor: 21.19 ml/min/mmHg
DLCO unc % pred: 56 %
FEF 25-75 POST: 3.31 L/s
FEF 25-75 Pre: 2.6 L/sec
FEF2575-%Change-Post: 27 %
FEF2575-%PRED-POST: 116 %
FEF2575-%Pred-Pre: 91 %
FEV1-%CHANGE-POST: 4 %
FEV1-%Pred-Post: 79 %
FEV1-%Pred-Pre: 76 %
FEV1-POST: 3 L
FEV1-Pre: 2.86 L
FEV1FVC-%CHANGE-POST: 2 %
FEV1FVC-%PRED-PRE: 106 %
FEV6-%Change-Post: 4 %
FEV6-%Pred-Post: 77 %
FEV6-%Pred-Pre: 74 %
FEV6-Post: 3.77 L
FEV6-Pre: 3.62 L
FEV6FVC-%Change-Post: 1 %
FEV6FVC-%PRED-POST: 105 %
FEV6FVC-%Pred-Pre: 103 %
FVC-%CHANGE-POST: 2 %
FVC-%PRED-PRE: 72 %
FVC-%Pred-Post: 73 %
FVC-POST: 3.77 L
FVC-PRE: 3.67 L
POST FEV1/FVC RATIO: 80 %
PRE FEV1/FVC RATIO: 78 %
PRE FEV6/FVC RATIO: 99 %
Post FEV6/FVC ratio: 100 %

## 2016-06-05 ENCOUNTER — Ambulatory Visit: Payer: Medicare Other | Admitting: Pulmonary Disease

## 2016-06-05 ENCOUNTER — Ambulatory Visit (INDEPENDENT_AMBULATORY_CARE_PROVIDER_SITE_OTHER): Payer: Medicare Other | Admitting: Pulmonary Disease

## 2016-06-05 ENCOUNTER — Encounter: Payer: Self-pay | Admitting: Pulmonary Disease

## 2016-06-05 VITALS — BP 132/72 | HR 75 | Ht 74.0 in | Wt 240.0 lb

## 2016-06-05 DIAGNOSIS — J849 Interstitial pulmonary disease, unspecified: Secondary | ICD-10-CM

## 2016-06-05 DIAGNOSIS — Z720 Tobacco use: Secondary | ICD-10-CM | POA: Diagnosis not present

## 2016-06-05 DIAGNOSIS — F17210 Nicotine dependence, cigarettes, uncomplicated: Secondary | ICD-10-CM | POA: Diagnosis not present

## 2016-06-05 NOTE — Assessment & Plan Note (Signed)
Counseled for 5 mintues today on the importance of quitting smoking. We talked about Chantix, he is not interested in taking this because of some adverse effects he seen in his friends use taken the medication. We did talk about the risks and benefits of taking the medicine but he still not interested. I gave him information regarding community resources for smoking cessation.  Because he smoked one pack of cigarettes daily for 35 years and cut down one half pack 5 years ago he qualifies for lung cancer screening. I will refer him to the lung cancer screening program.

## 2016-06-05 NOTE — Assessment & Plan Note (Signed)
Mr. Talavera has no symptoms of worsening shortness of breath and though he had a slight decline in his forced vital capacity his diffusion capacity in 6 minute walk distance and ambulatory oxygenation have all improved since the last visit. Considering this, I have very little evidence that he has a progressive interstitial lung disease.  While I cannot say exactly what has caused his scarring as seen on the June 2017 CT scan, at this time with a lack of distinct features of usual interstitial pneumonitis I do not feel that he has IPF. We will continue monitoring with serial lung function testing in 6 minute walks.  Plan: Follow-up one year with pulmonary function testing 6 minute walk He is to report to me if he has any respiratory symptoms in the meantime.

## 2016-06-05 NOTE — Progress Notes (Signed)
Subjective:    Patient ID: Joseph Green, male    DOB: 1945/08/13, 71 y.o.   MRN: [id number]  Synopsis: Referred in 2017 for evaluation of an underlying interstitial lung disease.   HPI Chief Complaint  Patient presents with  . Follow-up    review pft and 59mw.  pt states he is doing well, no complaints today.      Joseph Green [place] the beach [place].  His breathing has been OK.  No limitation from his breathing.  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  His sinus drainage has been about the same.     Past Medical History:  Diagnosis Date  . Environmental allergies   . Hypercholesteremia   . Hypertension   . Personal history of colonic polyps 07/12/2008      Review of Systems  Constitutional: Negative for chills, fatigue and fever.  HENT: Positive for postnasal drip. Negative for rhinorrhea, sinus pain and sinus pressure.   Respiratory: Positive for cough. Negative for shortness of breath and wheezing.   Cardiovascular: Negative for chest pain, palpitations and leg swelling.       Objective:   Physical Exam Vitals:   06/05/16 0925  BP: 132/72  Pulse: 75  SpO2: 96%  Weight: 240 lb (108.9 kg)  Height: 6\' 2"  (1.88 m)   RA  Gen: well appearing HENT: OP clear, TM's clear, neck supple PULM: Few crackles  B, normal percussion CV: RRR, no mgr, trace edema GI: BS+, soft, nontender Derm: no cyanosis or rash Psyche: normal mood and affect    Imaging: June 2017 CT chest images personally reviewed showing scattered groundglass and patchy distribution bilaterally, upper lobe predominant, there is some fibrotic change associated with this but no distinct emphysema based on my review.  10/2015 aldolase, crp, ana, scl-70, ssa/ssb, ccp, Jo-1, hypersensitivity pneumonitis all negative  PFT: August 2017 pulmonary function testing normal ratio, FEV1 3.33 L, 87% predicted, FVC 4.25 L,  83% predicted, DLCO 20.4, 54% predicted, total lung capacity not reported. February 2018 pulmonary function testing FVC 3.77 L, 73% predicted, ratio normal DLCO 21.37 56% predicted  6MW: September 2017 6 minute walk 396.5 m, O2 saturation nadir 95% on room air 05/2016 6MW: 436 m O2 saturation nadir 97% RA      Assessment & Plan:  Cigarette smoker Counseled for 5 mintues today on the importance of quitting smoking. We talked about Chantix, he is not interested in taking this because of some adverse effects he seen in his friends use taken the medication. We did talk about the risks and benefits of taking the medicine but he still not interested. I gave him information regarding community resources for smoking cessation.  Because he smoked one pack of cigarettes daily for 35 years and cut down one half pack 5 years ago he qualifies for lung cancer screening. I will refer him to the lung cancer screening program.  ILD (interstitial lung disease) [facility]) Joseph Green has no symptoms of worsening shortness of breath and though he had a slight decline in his forced vital capacity his diffusion capacity in 6 minute walk distance and ambulatory oxygenation have all improved since the last visit. Considering this, I have very little evidence that he has a progressive interstitial lung disease.  While I cannot say exactly what has caused his scarring as seen on the June 2017 CT scan,  at this time with a lack of distinct features of usual interstitial pneumonitis I do not feel that he has IPF. We will continue monitoring with serial lung function testing in 6 minute walks.  Plan: Follow-up one year with pulmonary function testing 6 minute walk He is to report to me if he has any respiratory symptoms in the meantime.  13 minutes spent face to face, 26 minute visit   Current Outpatient Prescriptions:  .  amLODipine (NORVASC) 10 MG tablet, Take 10 mg by mouth daily., Disp: , Rfl:  .  aspirin 81 MG  tablet, Take 81 mg by mouth daily., Disp: , Rfl:  .  fluticasone (FLONASE) 50 MCG/ACT nasal spray, Place 1 spray into both nostrils daily. , Disp: , Rfl:  .  OVER THE COUNTER MEDICATION, Fish oil 2000mg , Disp: , Rfl:  .  oxymetazoline (AFRIN) 0.05 % nasal spray, Place 1 spray into both nostrils at bedtime as needed for congestion. Pt takes [facility] brand, Disp: , Rfl:  .  rosuvastatin (CRESTOR) 5 MG tablet, Take 5 mg by mouth at bedtime., Disp: , Rfl:

## 2016-06-05 NOTE — Patient Instructions (Signed)
Quit smoking Consider attending one of the classes in our community for smoking cessation We will refer you to the lung cancer screening program We will arrange another lung function test in 6 minute walk in one year when you come back

## 2016-06-13 ENCOUNTER — Telehealth: Payer: Self-pay | Admitting: Pulmonary Disease

## 2016-06-13 DIAGNOSIS — F17210 Nicotine dependence, cigarettes, uncomplicated: Secondary | ICD-10-CM

## 2016-06-13 NOTE — Telephone Encounter (Signed)
Pt calling back [phone]

## 2016-06-14 NOTE — Telephone Encounter (Signed)
Spoke with pt and scheduled for [facility] 07/03/16 11:30 CT ordered Nothing further needed

## 2016-07-03 ENCOUNTER — Ambulatory Visit (INDEPENDENT_AMBULATORY_CARE_PROVIDER_SITE_OTHER)
Admission: RE | Admit: 2016-07-03 | Discharge: 2016-07-03 | Disposition: A | Discharge status: Home / Self Care | Payer: Medicare Other | Source: Ambulatory Visit | Attending: Acute Care | Admitting: Acute Care

## 2016-07-03 ENCOUNTER — Ambulatory Visit (INDEPENDENT_AMBULATORY_CARE_PROVIDER_SITE_OTHER): Payer: Medicare Other | Admitting: Acute Care

## 2016-07-03 ENCOUNTER — Encounter: Payer: Self-pay | Admitting: Acute Care

## 2016-07-03 DIAGNOSIS — F17210 Nicotine dependence, cigarettes, uncomplicated: Secondary | ICD-10-CM

## 2016-07-03 DIAGNOSIS — Z87891 Personal history of nicotine dependence: Secondary | ICD-10-CM

## 2016-07-03 NOTE — Progress Notes (Signed)
Shared Decision Making Visit Lung Cancer Screening Program [phone])   Eligibility:  Age 71 y.o.  Pack Years Smoking History Calculation 41-pack-year smoking history (# packs/per year x # years smoked)  Recent History of coughing up blood  no  Unexplained weight loss? no ( >Than 15 pounds within the last 6 months )  Prior History Lung / other cancer no (Diagnosis within the last 5 years already requiring surveillance chest CT Scans).  Smoking Status Current Smoker  Former Smokers: Years since quit:NA  Quit Date: NA  Visit Components:  Discussion included one or more decision making aids. yes  Discussion included risk/benefits of screening. yes  Discussion included potential follow up diagnostic testing for abnormal scans. yes  Discussion included meaning and risk of over diagnosis. yes  Discussion included meaning and risk of False Positives. yes  Discussion included meaning of total radiation exposure. yes  Counseling Included:  Importance of adherence to annual lung cancer LDCT screening. yes  Impact of comorbidities on ability to participate in the program. yes  Ability and willingness to under diagnostic treatment. yes  Smoking Cessation Counseling:  Current Smokers:   Discussed importance of smoking cessation. yes  Information about tobacco cessation classes and interventions provided to patient. yes  Patient provided with "ticket" for LDCT Scan. yes  Symptomatic Patient. No  Counseling  Diagnosis Code: Tobacco Use Z72.0  Asymptomatic Patient yes  Counseling (Intermediate counseling: > three minutes counseling) Z6606  Former Smokers:   Discussed the importance of maintaining cigarette abstinence. yes  Diagnosis Code: Personal History of Nicotine Dependence. T01.601  Information about tobacco cessation classes and interventions provided to patient. Yes  Patient provided with "ticket" for LDCT Scan. yes  Written Order for Lung Cancer  Screening with LDCT placed in Epic. Yes (CT Chest Lung Cancer Screening Low Dose W/O CM) [id number] Z12.2-Screening of respiratory organs Z87.891-Personal history of nicotine dependence   I have spent 25 minutes of face to face time with Mr. Golladay discussing the risks and benefits of lung cancer screening. We viewed a power point together that explained in detail the above noted topics. We paused at intervals to allow for questions to be asked and answered to ensure understanding.We discussed that the single most powerful action that he can take to decrease his risk of developing lung cancer is to quit smoking. We discussed whether or not he is ready to commit to setting a quit date. He is currently not ready to set a quit date We discussed options for tools to aid in quitting smoking including nicotine replacement therapy, non-nicotine medications, support groups, Quit Smart classes, and behavior modification. We discussed that often times setting smaller, more achievable goals, such as eliminating 1 cigarette a day for a week and then 2 cigarettes a day for a week can be helpful in slowly decreasing the number of cigarettes smoked. This allows for a sense of accomplishment as well as providing a clinical benefit. I gave him the " Be Stronger Than Your Excuses" card with contact information for community resources, classes, free nicotine replacement therapy, and access to mobile apps, text messaging, and on-line smoking cessation help. I have also given him my card and contact information in the event he needs to contact me. We discussed the time and location of the scan, and that either Doroteo Glassman RN or I will call with the results within 24-48 hours of receiving them. I have provided Mr. Uecker with a copy of the power point we viewed  as  a resource in the event they need reinforcement of the concepts we discussed today in the office. The patient verbalized understanding of all of  the above and had no  further questions upon leaving the office. They have my contact information in the event they have any further questions.  I spent 3-4 minutes counseling on his need for smoking cessation, and the health risks of continuing to smoke.  I explained that there has been a high incidence of coronary artery disease noted on these exams. Mr. Reep has failed statin therapy in the past. I explained that these scans are non-gated therefore degree or severity of coronary artery disease cannot be determined. I told him we will fax the results of the scan to his primary care provider to allow him to follow-up as he feels is clinically indicated. Mr. Panas verbalized understanding  Magdalen Spatz, NP 07/03/2016

## 2016-07-22 ENCOUNTER — Telehealth: Payer: Self-pay | Admitting: Acute Care

## 2016-07-22 DIAGNOSIS — F17210 Nicotine dependence, cigarettes, uncomplicated: Secondary | ICD-10-CM

## 2016-07-23 NOTE — Telephone Encounter (Signed)
Spoke with pt and informed of CT results per Eric Form, NP.  PT verbalized understanding.  Copy sent to Dr Lake Bells.  Order placed for 1 yr f/u CT.

## 2017-07-01 ENCOUNTER — Other Ambulatory Visit: Payer: Self-pay | Admitting: Pulmonary Disease

## 2017-07-01 DIAGNOSIS — R0600 Dyspnea, unspecified: Secondary | ICD-10-CM

## 2017-07-02 ENCOUNTER — Ambulatory Visit (INDEPENDENT_AMBULATORY_CARE_PROVIDER_SITE_OTHER): Payer: Medicare Other | Admitting: Pulmonary Disease

## 2017-07-02 DIAGNOSIS — R0600 Dyspnea, unspecified: Secondary | ICD-10-CM

## 2017-07-02 LAB — PULMONARY FUNCTION TEST
DL/VA % PRED: 71 %
DL/VA: 3.43 ml/min/mmHg/L
DLCO unc % pred: 53 %
DLCO unc: 20.16 ml/min/mmHg
FEF 25-75 POST: 1.51 L/s
FEF 25-75 PRE: 2.71 L/s
FEF2575-%CHANGE-POST: -44 %
FEF2575-%PRED-PRE: 97 %
FEF2575-%Pred-Post: 54 %
FEV1-%Change-Post: -9 %
FEV1-%PRED-PRE: 84 %
FEV1-%Pred-Post: 76 %
FEV1-PRE: 3.15 L
FEV1-Post: 2.86 L
FEV1FVC-%CHANGE-POST: 0 %
FEV1FVC-%PRED-PRE: 105 %
FEV6-%CHANGE-POST: -9 %
FEV6-%PRED-PRE: 84 %
FEV6-%Pred-Post: 76 %
FEV6-Post: 3.68 L
FEV6-Pre: 4.07 L
FEV6FVC-%Change-Post: 0 %
FEV6FVC-%PRED-POST: 104 %
FEV6FVC-%Pred-Pre: 104 %
FVC-%CHANGE-POST: -9 %
FVC-%PRED-PRE: 81 %
FVC-%Pred-Post: 73 %
FVC-POST: 3.72 L
FVC-PRE: 4.11 L
POST FEV6/FVC RATIO: 99 %
PRE FEV1/FVC RATIO: 77 %
Post FEV1/FVC ratio: 77 %
Pre FEV6/FVC Ratio: 99 %

## 2017-07-02 NOTE — Progress Notes (Signed)
PFT done today. 

## 2017-07-04 ENCOUNTER — Ambulatory Visit (INDEPENDENT_AMBULATORY_CARE_PROVIDER_SITE_OTHER)
Admission: RE | Admit: 2017-07-04 | Discharge: 2017-07-04 | Disposition: A | Discharge status: Home / Self Care | Payer: Medicare Other | Source: Ambulatory Visit | Attending: Acute Care | Admitting: Acute Care

## 2017-07-04 DIAGNOSIS — F17210 Nicotine dependence, cigarettes, uncomplicated: Secondary | ICD-10-CM

## 2017-07-07 ENCOUNTER — Other Ambulatory Visit: Payer: Self-pay | Admitting: Acute Care

## 2017-07-07 DIAGNOSIS — F17210 Nicotine dependence, cigarettes, uncomplicated: Secondary | ICD-10-CM

## 2017-07-07 DIAGNOSIS — Z122 Encounter for screening for malignant neoplasm of respiratory organs: Secondary | ICD-10-CM

## 2017-07-09 ENCOUNTER — Ambulatory Visit (INDEPENDENT_AMBULATORY_CARE_PROVIDER_SITE_OTHER): Payer: Medicare Other | Admitting: *Deleted

## 2017-07-09 ENCOUNTER — Ambulatory Visit: Payer: Medicare Other | Admitting: Pulmonary Disease

## 2017-07-09 ENCOUNTER — Encounter: Payer: Self-pay | Admitting: Pulmonary Disease

## 2017-07-09 VITALS — BP 144/76 | HR 72 | Ht 74.0 in | Wt 241.0 lb

## 2017-07-09 DIAGNOSIS — J849 Interstitial pulmonary disease, unspecified: Secondary | ICD-10-CM

## 2017-07-09 MED ORDER — BUPROPION HCL ER (SR) 150 MG PO TB12: 150.0000 mg | ORAL_TABLET | Freq: Two times a day (BID) | ORAL | 3 refills | Status: DC

## 2017-07-09 MED ORDER — NICOTINE 10 MG IN INHA: 1.0000 | RESPIRATORY_TRACT | 3 refills | Status: DC | PRN

## 2017-07-09 MED ORDER — MONTELUKAST SODIUM 10 MG PO TABS: 10.0000 mg | ORAL_TABLET | Freq: Every day | ORAL | 11 refills | Status: DC

## 2017-07-09 NOTE — Patient Instructions (Signed)
Interstitial lung disease. Today's lung function testing was stable, 6-minute walk was stable We will plan on repeating lung function testing and a 6-minute walk in 1 year  Tobacco abuse: Continue plans for annual CT scans with the lung cancer screening program We will prescribe Wellbutrin 150 mg twice a day We will prescribe Nicotrol inhaler I am glad that you are trying to quit smoking  We will see you back in about 6-8 weeks to see how you are doing with the Wellbutrin

## 2017-07-09 NOTE — Progress Notes (Signed)
SIX MIN WALK 07/09/2017 06/03/2016 12/26/2015  Medications amlodipine 10mg  and aspirin 81mg  taken around 5:30 Amlodipine, ASA 81 Blood pressure meds, fish oil, allergy meds; no inhalers  Supplimental Oxygen during Test? (L/min) No No No  Laps 9 9 8   Partial Lap (in Meters) 0 4 12.5  Baseline BP (sitting) 122/78 136/74 160/96  Baseline Heartrate 69 65 79  Baseline Dyspnea (Borg Scale) 2 0.5 3  Baseline Fatigue (Borg Scale) 1 2 2   Baseline SPO2 97 97 95  BP (sitting) 160/80 146/82 178/92  Heartrate 85 92 96  Dyspnea (Borg Scale) 2 1 3   Fatigue (Borg Scale) 3 1 2   SPO2 97 97 97  BP (sitting) 130/72 140/80 150/82  Heartrate 73 72 92  SPO2 98 97 96  Stopped or Paused before Six Minutes No No No  Distance Completed 432 436 396.5  Tech Comments: Pt walked at a normal to moderate pace denying any complaints during walk. Pt. walked a steady rapid place. He did well on his walk and did not take any breaks. He did not express any dyspnea or pain during or after walk Patient had no complaints but noticed patient started coughing during test

## 2017-07-09 NOTE — Progress Notes (Signed)
Subjective:    Patient ID: Joseph Green, male    DOB: 1945/07/12, 72 y.o.   MRN: [id number]  Synopsis: Referred in 2017 for evaluation of an underlying interstitial lung disease.   HPI Chief Complaint  Patient presents with  . Follow-up    review 76mw.  pt c/o unchanged doe.     Griffen says he has bene active working outside.  He was staying at the beach for the last few weeks and felt OK.  He goes there about once a month.  He feels that his breathing has been doing OK.  He says that sometimes if he is sitting and watching TV he will have a bad coughing spell in the mornings.  He may coiugh up some mucus in th emornings.  He says that when he coughs up mucus it is clear.  He has a lot of sinus congestion and he feels that this is the cause of the symptoms. He has been taking mucinex and allegra.  He has been taking the [facility] brand of fluticasone.    He is still smoking 1/2 pack of cigarettes daily.  He says he is interested in trying medicine to help quit.  Past Medical History:  Diagnosis Date  . Environmental allergies   . Hypercholesteremia   . Hypertension   . Personal history of colonic polyps 07/12/2008      Review of Systems  Constitutional: Negative for chills, fatigue and fever.  HENT: Positive for postnasal drip. Negative for rhinorrhea, sinus pressure and sinus pain.   Respiratory: Positive for cough. Negative for shortness of breath and wheezing.   Cardiovascular: Negative for chest pain, palpitations and leg swelling.       Objective:   Physical Exam Vitals:   07/09/17 1112  BP: (!) 144/76  Pulse: 72  SpO2: 98%  Weight: 241 lb (109.3 kg)  Height: 6\' 2"  (1.88 m)   RA  Gen: well appearing HENT: OP clear, TM's clear, neck supple PULM: CTA B, normal percussion CV: RRR, no mgr, trace edema GI: BS+, soft, nontender Derm: no cyanosis or rash Psyche: normal mood and affect    Imaging: June 2017 CT chest images personally reviewed showing scattered  groundglass and patchy distribution bilaterally, upper lobe predominant, there is some fibrotic change associated with this but no distinct emphysema based on my review. March 2019 lung cancer screening CT images personally reviewed showing once again reticular and interstitial abnormalities and a bronchovascular distribution in the upper lobes in a peripheral distribution in the lower lobes, RADS 2.  10/2015 aldolase, crp, ana, scl-70, ssa/ssb, ccp, Jo-1, hypersensitivity pneumonitis all negative  PFT: August 2017 pulmonary function testing normal ratio, FEV1 3.33 L, 87% predicted, FVC 4.25 L, 83% predicted, DLCO 20.4, 54% predicted, total lung capacity not reported. February 2018 pulmonary function testing FVC 3.77 L, 73% predicted, DLCO 21.37 56% predicted March 2019 forced vital capacity 3.72 L 73% predicted, DLCO 20.16 mL 53% predicted  6MW: September 2017 6 minute walk 396.5 m, O2 saturation nadir 95% on room air 05/2016 6MW: 436 m O2 saturation nadir 97% RA 06/2017 6MW: 456m O2 saturation nadir 98% RA      Assessment & Plan:   No diagnosis found.  Discussion: From an interstitial lung disease perspective I see no evidence of progression.  His forced vital capacity and DLCO have  stable lately.  His CT scan of the chest did not show significant worsening, though admittedly this test was performed  for lung cancer screening.  Given his lack of progression in symptoms and lung function testing instability and his 6-minute walk I see no evidence that the interstitial lung disease has progressed.  I do not feel that this is IPF.  We spent about 10 minutes today talking about smoking cessation.  He says he wants to try some medicines.  He is not interested in taking Chantix because he has had to friends who had severe side effects from it.  He says he is concerned primarily about depression and anxiety after stopping smoking.  Given this I think the best combination for him would be to use the  Nicotrol inhaler and Wellbutrin.    Plan: Interstitial lung disease. Today's lung function testing was stable, 6-minute walk was stable We will plan on repeating lung function testing and a 6-minute walk in 1 year  Tobacco abuse: Continue plans for annual CT scans with the lung cancer screening program We will prescribe Wellbutrin 150 mg twice a day We will prescribe Nicotrol inhaler I am glad that you are trying to quit smoking  We will see you back in about 6-8 weeks to see how you are doing with the Wellbutrin  > 50% of this 25 min visit spent face to face   Current Outpatient Medications:  .  amLODipine (NORVASC) 10 MG tablet, Take 10 mg by mouth daily., Disp: , Rfl:  .  aspirin 81 MG tablet, Take 81 mg by mouth daily., Disp: , Rfl:  .  fluticasone (FLONASE) 50 MCG/ACT nasal spray, Place 1 spray into both nostrils daily. , Disp: , Rfl:  .  OVER THE COUNTER MEDICATION, Fish oil 2000mg , Disp: , Rfl:  .  oxymetazoline (AFRIN) 0.05 % nasal spray, Place 1 spray into both nostrils at bedtime as needed for congestion. Pt takes [facility] brand, Disp: , Rfl:  .  rosuvastatin (CRESTOR) 5 MG tablet, Take 5 mg by mouth at bedtime., Disp: , Rfl:

## 2017-08-05 ENCOUNTER — Other Ambulatory Visit: Payer: Self-pay

## 2017-08-05 MED ORDER — BUPROPION HCL ER (SR) 150 MG PO TB12: 150.0000 mg | ORAL_TABLET | Freq: Two times a day (BID) | ORAL | 1 refills | Status: DC

## 2017-08-27 ENCOUNTER — Ambulatory Visit: Payer: Medicare Other | Admitting: Pulmonary Disease

## 2018-02-12 IMAGING — CT CT CHEST LUNG CANCER SCREENING LOW DOSE W/O CM
1 of 3 series · 10 of 30 positions shown, 13 images · non-contrast
Comparison: None

CLINICAL DATA: Lung cancer screening.  37.5 pack-year history.

EXAM:
CT CHEST WITHOUT CONTRAST LOW-DOSE FOR LUNG CANCER SCREENING
TECHNIQUE: Multidetector CT imaging of the chest was performed following the
standard protocol without IV contrast.

[ct lung segmentation data · axial · 0.75mm/px · z∈[-222,-222]mm · 10 of 254 frames shown]
[frame 1/254  mediastinal]
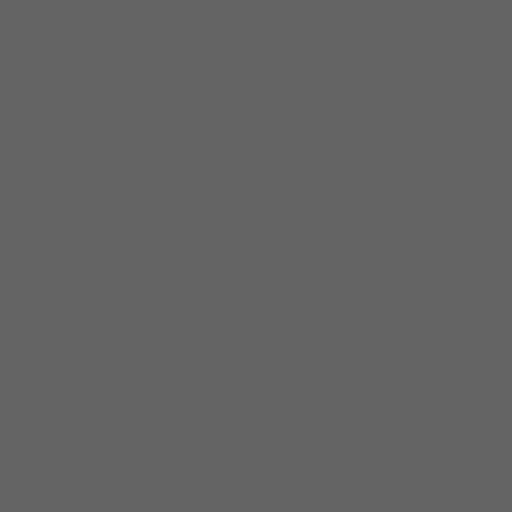
[frame 1/254  lung]
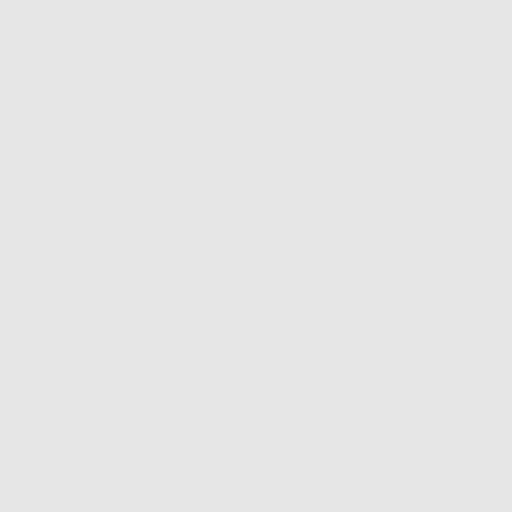
[frame 29/254  lung]
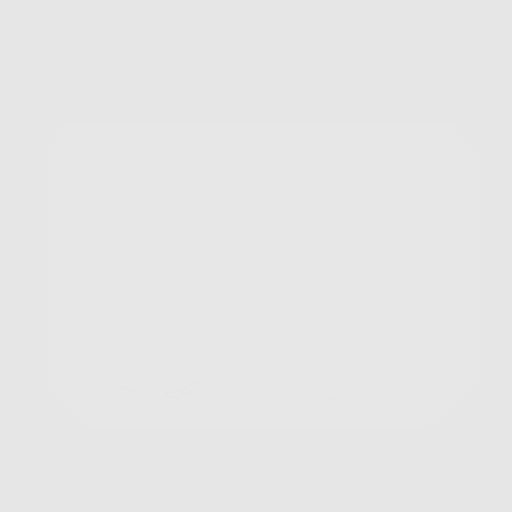
[frame 57/254  lung]
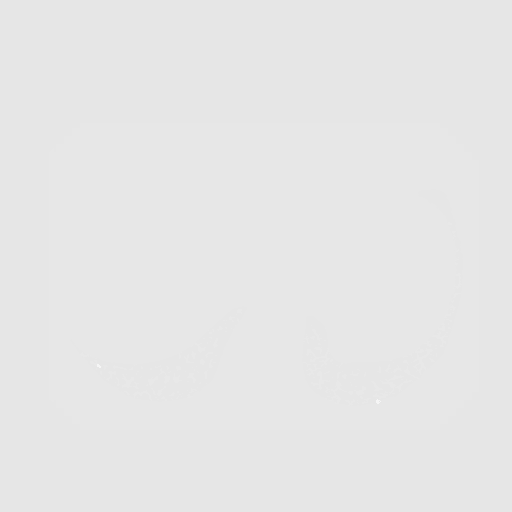
[frame 85/254  lung]
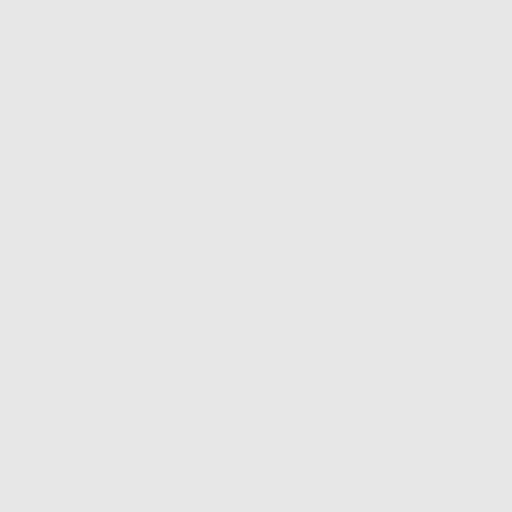
[frame 113/254  mediastinal]
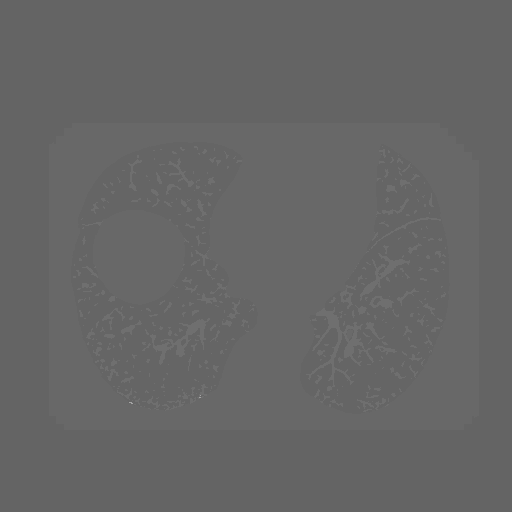
[frame 113/254  lung]
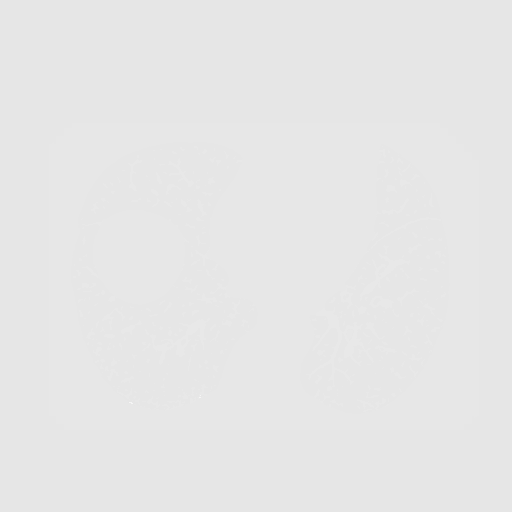
[frame 141/254  lung]
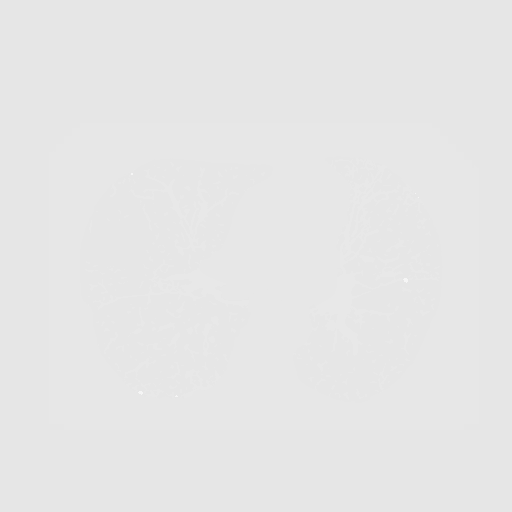
[frame 169/254  lung]
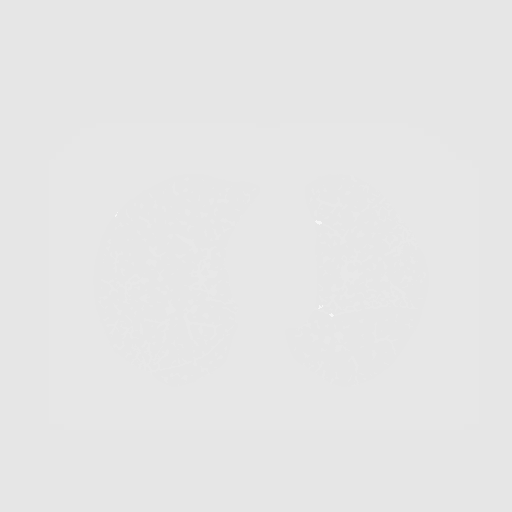
[frame 197/254  lung]
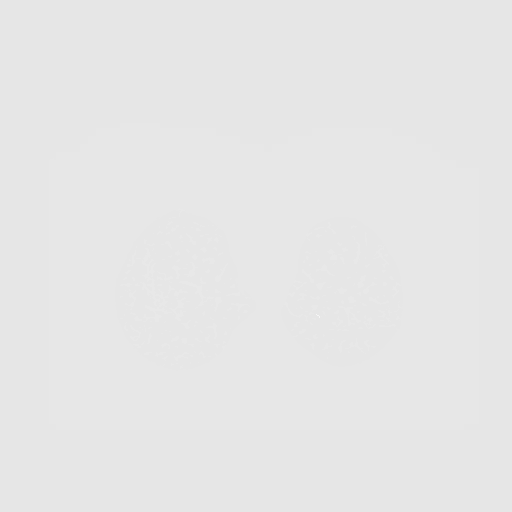
[frame 225/254  mediastinal]
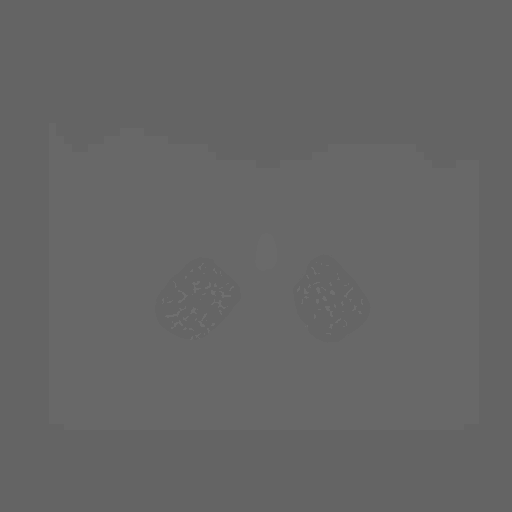
[frame 225/254  lung]
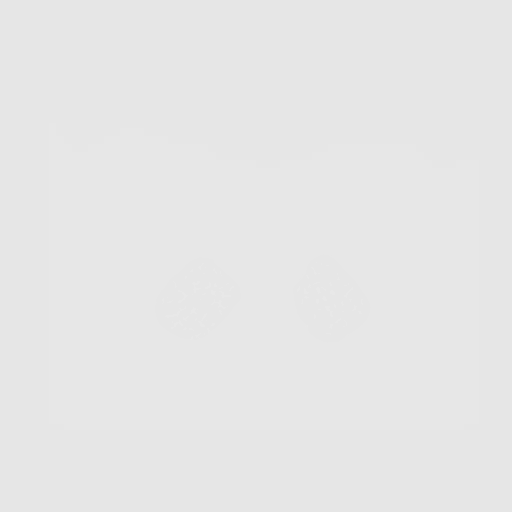
[frame 254/254  lung]
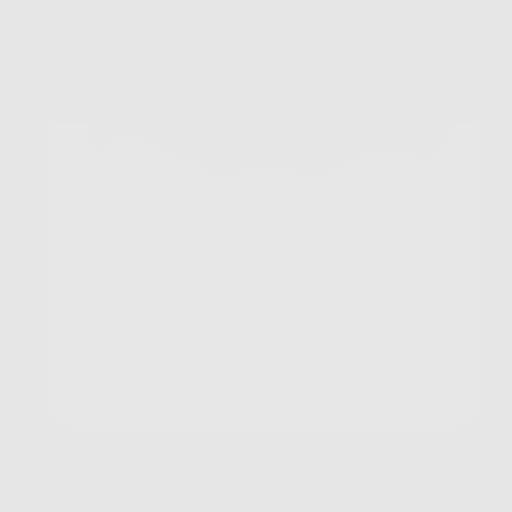

[10 of 30 positions shown; findings below may reference images not displayed]

FINDINGS: Cardiovascular: The heart size appears normal. No pericardial
effusion identified. No axillary or supraclavicular adenopathy.

Mediastinum/Nodes: No enlarged mediastinal, hilar, or axillary lymph
nodes. Thyroid gland, trachea, and esophagus demonstrate no
significant findings.

Lungs/Pleura: No pleural fluid identified. Mild to moderate changes
of centrilobular emphysema. Additionally, upper lobe predominant
chronic interstitial changes including including areas of
bronchiectasis, interstitial reticulation and patchy areas of
ground-glass attenuation are noted.

Multiple small pulmonary nodules are identified. The largest nodule
is in the perifissural right lower lobe with an equivalent diameter
5.8 mm.

Upper Abdomen: No acute abnormality.

Musculoskeletal: No chest wall mass or suspicious bone lesions
identified.No aggressive lytic or sclerotic bone lesions identified.
Degenerative disc disease identified within the thoracic spine.
IMPRESSION: 1. Lung-RADS Category 2s, benign appearance or behavior. Continue
annual screening with low-dose chest CT without contrast in 12
months
2. S modify above refers to upper lobe predominant, chronic appear
interstitial changes including ground-glass attenuation,
bronchiectasis and interstitial reticulation. Recommend further
investigation with pulmonary function tests and high-resolution CT
of the chest.
3. Emphysema

## 2018-03-19 ENCOUNTER — Encounter: Payer: Self-pay | Admitting: Internal Medicine

## 2018-04-21 ENCOUNTER — Ambulatory Visit (AMBULATORY_SURGERY_CENTER): Payer: Self-pay | Admitting: *Deleted

## 2018-04-21 ENCOUNTER — Encounter: Payer: Self-pay | Admitting: Internal Medicine

## 2018-04-21 VITALS — Ht 74.0 in | Wt 236.0 lb

## 2018-04-21 DIAGNOSIS — Z86010 Personal history of colonic polyps: Secondary | ICD-10-CM

## 2018-04-21 NOTE — Progress Notes (Signed)
Patient denies any allergies to eggs or soy. Patient denies any problems with anesthesia/sedation. Patient denies any oxygen use at home. Patient denies taking any diet/weight loss medications or blood thinners. EMMI education offered, patient declined. Patient denies constipation.

## 2018-04-30 ENCOUNTER — Encounter: Payer: Self-pay | Admitting: Internal Medicine

## 2018-04-30 ENCOUNTER — Ambulatory Visit (AMBULATORY_SURGERY_CENTER): Payer: Medicare Other | Admitting: Internal Medicine

## 2018-04-30 VITALS — BP 162/78 | HR 123 | Temp 98.4°F | Resp 22 | Ht 74.0 in | Wt 241.0 lb

## 2018-04-30 DIAGNOSIS — D120 Benign neoplasm of cecum: Secondary | ICD-10-CM

## 2018-04-30 DIAGNOSIS — D123 Benign neoplasm of transverse colon: Secondary | ICD-10-CM

## 2018-04-30 DIAGNOSIS — Z86010 Personal history of colonic polyps: Secondary | ICD-10-CM

## 2018-04-30 MED ORDER — SODIUM CHLORIDE 0.9 % IV SOLN: 500.0000 mL | Freq: Once | INTRAVENOUS | Status: DC

## 2018-04-30 NOTE — Progress Notes (Signed)
Report given to PACU, vss 

## 2018-04-30 NOTE — Op Note (Signed)
[facility] Patient Name: Joseph Green Procedure Date: 04/30/2018 4:00 PM MRN: [id number] Endoscopist: Gatha Mayer , MD Age: 73 Referring MD:  Date of Birth: 1945-10-02 Gender: Male Account #: [id number] Procedure:                Colonoscopy Indications:              Surveillance: Personal history of adenomatous                            polyps on last colonoscopy 3 years ago Medicines:                Propofol per Anesthesia, Monitored Anesthesia Care Procedure:                Pre-Anesthesia Assessment:                           - Prior to the procedure, a History and Physical                            was performed, and patient medications and                            allergies were reviewed. The patient's tolerance of                            previous anesthesia was also reviewed. The risks                            and benefits of the procedure and the sedation                            options and risks were discussed with the patient.                            All questions were answered, and informed consent                            was obtained. Prior Anticoagulants: The patient has                            taken no previous anticoagulant or antiplatelet                            agents. ASA Grade Assessment: III - A patient with                            severe systemic disease. After reviewing the risks                            and benefits, the patient was deemed in                            satisfactory condition to undergo the procedure.  After obtaining informed consent, the colonoscope                            was passed under direct vision. Throughout the                            procedure, the patient's blood pressure, pulse, and                            oxygen saturations were monitored continuously. The                            Colonoscope was introduced through the anus and   advanced to the the cecum, identified by                            appendiceal orifice and ileocecal valve. The                            colonoscopy was performed without difficulty. The                            patient tolerated the procedure well. The quality                            of the bowel preparation was good. The ileocecal                            valve, appendiceal orifice, and rectum were                            photographed. The bowel preparation used was                            Miralax. Scope In: 4:09:01 PM Scope Out: 4:26:43 PM Scope Withdrawal Time: 0 hours 14 minutes 35 seconds  Total Procedure Duration: 0 hours 17 minutes 42 seconds  Findings:                 The perianal and digital rectal examinations were                            normal.                           Three sessile polyps were found in the transverse                            colon and cecum. The polyps were diminutive in                            size. These polyps were removed with a cold snare.                            Resection and retrieval were complete. Verification  of patient identification for the specimen was                            done. Estimated blood loss was minimal.                           Multiple diverticula were found in the sigmoid                            colon.                           The exam was otherwise without abnormality on                            direct and retroflexion views. Complications:            No immediate complications. Estimated Blood Loss:     Estimated blood loss was minimal. Impression:               - Three diminutive polyps in the transverse colon                            and in the cecum, removed with a cold snare.                            Resected and retrieved.                           - Diverticulosis in the sigmoid colon.                           - The examination was otherwise normal on  direct                            and retroflexion views.                           - Personal history of colonic polyps. Recommendation:           - Patient has a contact number available for                            emergencies. The signs and symptoms of potential                            delayed complications were discussed with the                            patient. Return to normal activities tomorrow.                            Written discharge instructions were provided to the                            patient.                           -  Resume previous diet.                           - Continue present medications.                           - No recommendation at this time regarding repeat                            colonoscopy due to age and co-morbidities - doubt I                            will recommend a routine repeat but will await                            review of pathology. Gatha Mayer, MD 04/30/2018 4:37:14 PM This report has been signed electronically.

## 2018-04-30 NOTE — Progress Notes (Signed)
Called to room to assist during endoscopic procedure.  Patient ID and intended procedure confirmed with present staff. Received instructions for my participation in the procedure from the performing physician.  

## 2018-04-30 NOTE — Patient Instructions (Addendum)
   I removed 3 tiny polyps.  I will let you know pathology results and plans by mail.  I appreciate the opportunity to care for you. Gatha Mayer, MD, FACG  YOU HAD AN ENDOSCOPIC PROCEDURE TODAY AT [facility]:   Refer to the procedure report that was given to you for any specific questions about what was found during the examination.  If the procedure report does not answer your questions, please call your gastroenterologist to clarify.  If you requested that your care partner not be given the details of your procedure findings, then the procedure report has been included in a sealed envelope for you to review at your convenience later.  YOU SHOULD EXPECT: Some feelings of bloating in the abdomen. Passage of more gas than usual.  Walking can help get rid of the air that was put into your GI tract during the procedure and reduce the bloating. If you had a lower endoscopy (such as a colonoscopy or flexible sigmoidoscopy) you may notice spotting of blood in your stool or on the toilet paper. If you underwent a bowel prep for your procedure, you may not have a normal bowel movement for a few days.  Please Note:  You might notice some irritation and congestion in your nose or some drainage.  This is from the oxygen used during your procedure.  There is no need for concern and it should clear up in a day or so.  SYMPTOMS TO REPORT IMMEDIATELY:   Following lower endoscopy (colonoscopy or flexible sigmoidoscopy):  Excessive amounts of blood in the stool  Significant tenderness or worsening of abdominal pains  Swelling of the abdomen that is new, acute  Fever of 100F or higher    For urgent or emergent issues, a gastroenterologist can be reached at any hour by calling [phone].   DIET:  We do recommend a small meal at first, but then you may proceed to your regular diet.  Drink plenty of fluids but you should avoid alcoholic beverages for 24 hours.  ACTIVITY:  You  should plan to take it easy for the rest of today and you should NOT DRIVE or use heavy machinery until tomorrow (because of the sedation medicines used during the test).    FOLLOW UP: Our staff will call the number listed on your records the next business day following your procedure to check on you and address any questions or concerns that you may have regarding the information given to you following your procedure. If we do not reach you, we will leave a message.  However, if you are feeling well and you are not experiencing any problems, there is no need to return our call.  We will assume that you have returned to your regular daily activities without incident.  If any biopsies were taken you will be contacted by phone or by letter within the next 1-3 weeks.  Please call us at [phone] if you have not heard about the biopsies in 3 weeks.    SIGNATURES/CONFIDENTIALITY: You and/or your care partner have signed paperwork which will be entered into your electronic medical record.  These signatures attest to the fact that that the information above on your After Visit Summary has been reviewed and is understood.  Full responsibility of the confidentiality of this discharge information lies with you and/or your care-partner.  Read all handouts given to you by your recovery room nurse.

## 2018-05-01 ENCOUNTER — Telehealth: Payer: Self-pay

## 2018-05-01 ENCOUNTER — Telehealth: Payer: Self-pay | Admitting: *Deleted

## 2018-05-01 NOTE — Telephone Encounter (Signed)
  Follow up Call-  Call back number 04/30/2018  Post procedure Call Back phone  # [phone] or [phone] house phone with VM  Permission to leave phone message Yes  Some recent data might be hidden    [facility]

## 2018-05-01 NOTE — Telephone Encounter (Signed)
  Follow up Call-  Call back number 04/30/2018  Post procedure Call Back phone  # [phone] or [phone] house phone with VM  Permission to leave phone message Yes  Some recent data might be hidden     Patient questions:  Do you have a fever, pain , or abdominal swelling? No. Pain Score  0 *  Have you tolerated food without any problems? Yes.    Have you been able to return to your normal activities? Yes.    Do you have any questions about your discharge instructions: Diet   No. Medications  No. Follow up visit  No.  Do you have questions or concerns about your Care? No.  Actions: * If pain score is 4 or above: No action needed, pain <4.

## 2018-05-06 ENCOUNTER — Other Ambulatory Visit: Payer: Self-pay | Admitting: Internal Medicine

## 2018-05-06 DIAGNOSIS — N183 Chronic kidney disease, stage 3 unspecified: Secondary | ICD-10-CM

## 2018-05-07 ENCOUNTER — Ambulatory Visit
Admission: RE | Admit: 2018-05-07 | Discharge: 2018-05-07 | Disposition: A | Discharge status: Home / Self Care | Payer: Medicare Other | Source: Ambulatory Visit | Attending: Internal Medicine | Admitting: Internal Medicine

## 2018-05-07 DIAGNOSIS — N183 Chronic kidney disease, stage 3 unspecified: Secondary | ICD-10-CM

## 2018-05-15 ENCOUNTER — Encounter: Payer: Self-pay | Admitting: Internal Medicine

## 2018-05-15 DIAGNOSIS — Z86010 Personal history of colonic polyps: Secondary | ICD-10-CM

## 2018-05-15 NOTE — Progress Notes (Signed)
3 diminutive adenomas No recall - age and co-morbidities (emphysema)

## 2018-07-06 ENCOUNTER — Inpatient Hospital Stay: Admission: RE | Admit: 2018-07-06 | Payer: Medicare Other | Source: Ambulatory Visit

## 2018-08-31 ENCOUNTER — Other Ambulatory Visit: Payer: Self-pay | Admitting: Acute Care

## 2018-08-31 DIAGNOSIS — Z720 Tobacco use: Secondary | ICD-10-CM

## 2018-09-23 ENCOUNTER — Inpatient Hospital Stay: Admission: RE | Admit: 2018-09-23 | Payer: Medicare Other | Source: Ambulatory Visit

## 2018-10-22 ENCOUNTER — Telehealth: Payer: Self-pay | Admitting: Acute Care

## 2018-10-22 NOTE — Telephone Encounter (Signed)

## 2018-10-23 ENCOUNTER — Telehealth: Payer: Self-pay | Admitting: Acute Care

## 2018-10-23 ENCOUNTER — Other Ambulatory Visit: Payer: Self-pay

## 2018-10-23 ENCOUNTER — Ambulatory Visit (INDEPENDENT_AMBULATORY_CARE_PROVIDER_SITE_OTHER)
Admission: RE | Admit: 2018-10-23 | Discharge: 2018-10-23 | Disposition: A | Discharge status: Home / Self Care | Payer: Medicare Other | Source: Ambulatory Visit | Attending: Acute Care | Admitting: Acute Care

## 2018-10-23 DIAGNOSIS — F17210 Nicotine dependence, cigarettes, uncomplicated: Secondary | ICD-10-CM

## 2018-10-23 DIAGNOSIS — Z87891 Personal history of nicotine dependence: Secondary | ICD-10-CM | POA: Diagnosis not present

## 2018-10-23 DIAGNOSIS — Z720 Tobacco use: Secondary | ICD-10-CM

## 2018-10-23 DIAGNOSIS — Z122 Encounter for screening for malignant neoplasm of respiratory organs: Secondary | ICD-10-CM

## 2018-10-23 NOTE — Telephone Encounter (Signed)
Received call report from [place] with [facility]  on patient's Ct chest lung cancer screening done on 10/23/18 . Sarah,NP please review the result/impression copied below:  IMPRESSION: 1. Lung-RADS 3, probably benign findings. Short-term follow-up in 6 months is recommended with repeat low-dose chest CT without contrast (please use the following order, "CT CHEST LCS NODULE FOLLOW-UP W/O CM"). Relatively linear endobronchial structure within the left mainstem bronchus and left upper lobe bronchus. Although this could represent secretions, given similarity over multiple prior exams, a foreign body or unlikely indolent neoplastic process cannot be excluded. If clinically indicated, bronchoscopy may be informative. 2. 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 3. Aortic atherosclerosis (ICD10-I70.0), coronary artery atherosclerosis and emphysema (ICD10-J43.9).    Please advise, thank you.  Message routed to Sela Hilding, NP

## 2018-10-26 ENCOUNTER — Ambulatory Visit (INDEPENDENT_AMBULATORY_CARE_PROVIDER_SITE_OTHER): Payer: Medicare Other | Admitting: Acute Care

## 2018-10-26 ENCOUNTER — Other Ambulatory Visit: Payer: Self-pay

## 2018-10-26 ENCOUNTER — Encounter: Payer: Self-pay | Admitting: Acute Care

## 2018-10-26 DIAGNOSIS — J329 Chronic sinusitis, unspecified: Secondary | ICD-10-CM

## 2018-10-26 DIAGNOSIS — F17210 Nicotine dependence, cigarettes, uncomplicated: Secondary | ICD-10-CM | POA: Diagnosis not present

## 2018-10-26 DIAGNOSIS — R9389 Abnormal findings on diagnostic imaging of other specified body structures: Secondary | ICD-10-CM

## 2018-10-26 DIAGNOSIS — Z720 Tobacco use: Secondary | ICD-10-CM

## 2018-10-26 NOTE — Telephone Encounter (Signed)
Order placed for 6 mth f/u low dose chest CT.  Results faxed to PCP.

## 2018-10-26 NOTE — Addendum Note (Signed)
Addended by: Doroteo Glassman D on: 10/26/2018 10:37 AM   Modules accepted: Orders

## 2018-10-26 NOTE — Telephone Encounter (Signed)
Called spoke with patient; telephone appt scheduled with Sarah NP for today 10/26/18 @ 1530.  Nothing further needed at this time; will sign off.

## 2018-10-26 NOTE — Telephone Encounter (Signed)
Please schedule for a tele visit with me today at 3:30. Or Wednesday at 11:30. Thanks so much. [name], please schedule a 6 month follow up CT and fax results to PCP. Thanks so much

## 2018-10-26 NOTE — Progress Notes (Signed)
Virtual Visit via Video Note  I connected with Joseph Green on 10/26/18 at  3:30 PM EDT by a video enabled telemedicine application and verified that I am speaking with the correct person using two identifiers.  Location: Patient: At Home Provider: In the office at [address] [place], [place], [address]   I discussed the limitations of evaluation and management by telemedicine and the availability of in person appointments. The patient expressed understanding and agreed to proceed.  History of Present Illness: Pt. Is seen through the  [facility]. 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 He had his LDCT done 10/23/2018. The scan was read as a Lung  RADS 3, nodules that are probably benign findings, short term follow up suggested: includes nodules with a low likelihood of becoming a clinically active cancer. Radiology recommends a 6 month repeat LDCT follow up.We discussed that we will need to do a short term follow up scan. I explained that this is most likely benign, but we will do the 6 month scan to ensure it is a stable nodule.We will order and schedule the screening scan for January of 2021.  He states he has a cough, and sinus issues . He suspects that this is contributing to the linear endobronchial structure within the left mainstem bronchus and left upper lobe bronchus. He states he has tried Mucinex, but that this has not worked well for him . He does drink water with the medication. I told him we will get him scheduled with me for a follow up office visit to establish a maintenance plan for his sinus issues. He is currently using [employer] brand Allegra daily. He feels he has constant post nasal drip.This is chronic. He denies fever, chest pain,orthopnea or hemoptysis.  We will call with a follow up appointment to evaluate sinus issues.    Observations/Objective: Low Dose CT scan Chest 10/23/2018 Cardiovascular: Aortic atherosclerosis. Normal heart size,  without pericardial effusion. Multivessel coronary artery atherosclerosis.  Mediastinum/Nodes: No mediastinal or definite hilar adenopathy, given limitations of unenhanced CT.  Lungs/Pleura: No pleural fluid. Mild centrilobular emphysema. Relatively similar appearance of upper lung and peripheral predominant reticulation and ground-glass opacity.  Endobronchial linear filling defect within the left mainstem and upper lobe bronchi. This has a similar appearance and morphology on prior exams including 07/03/2016. Example image 146/3 today.  Bilateral pulmonary nodules are otherwise similar, including at maximally volume derived equivalent diameter 5.7 mm in the right lower lobe. These are primarily subpleural predominant.  Upper Abdomen: Cholecystectomy. Normal imaged portions of the liver, stomach, pancreas, adrenal glands, right kidney. Moderate left renal cortical thinning. Calcifications along the anterior splenic capsule are similar and likely related to remote trauma or infection.  Musculoskeletal: Remote right clavicular fracture. Moderate thoracic spondylosis.  IMPRESSION: 1. Lung-RADS 3, probably benign findings. Short-term follow-up in 6 months is recommended with repeat low-dose chest CT without contrast (please use the following order, "CT CHEST LCS NODULE FOLLOW-UP W/O CM"). Relatively linear endobronchial structure within the left mainstem bronchus and left upper lobe bronchus. Although this could represent secretions, given similarity over multiple prior exams, a foreign body or unlikely indolent neoplastic process cannot be excluded. If clinically indicated, bronchoscopy may be informative. 2. 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 3. Aortic atherosclerosis (ICD10-I70.0), coronary artery atherosclerosis and emphysema (ICD10-J43.9).  Assessment and Plan: Abnormal Lung Cancer Screening Scan Lung  RADS 3,  nodules that are probably benign findings, short term follow up suggested:  includes nodules with a low likelihood of becoming a clinically active cancer. Radiology recommends a 6 month repeat LDCT follow up. Plan: We will schedule a 6 month follow up scanLangley Gauss please schedule) I will have Dr. Lake Bells review the scan and consider a bronch  Sinus issues Plan Follow up appointment to develop a maintenance treatment  for allergies  Tobacco Abuse I have spent 3 minutes counseling patient on smoking cessation this visit. Patient verbalizes understanding of their  choice to continue smoking and the negative health consequences including worsening of COPD, risk of lung cancer , stroke and heart disease..    Follow Up Instructions: CT Scan in 6 months Follow up Appointment with Judson Roch NP for sinus issues Please contact office for sooner follow up if symptoms do not improve or worsen or seek emergency care    I discussed the assessment and treatment plan with the patient. The patient was provided an opportunity to ask questions and all were answered. The patient agreed with the plan and demonstrated an understanding of the instructions.   The patient was advised to call back or seek an in-person evaluation if the symptoms worsen or if the condition fails to improve as anticipated.  I provided 23 minutes of non-face-to-face time during this encounter.   Magdalen Spatz, NP 10/26/2018 3:37 PM

## 2018-10-26 NOTE — Patient Instructions (Addendum)
It was good to talk with you today. We will schedule a 6 month follow up CT scan to re-evaluate the pulmonary nodule. I will have Dr. Lake Bells review the scan  Follow up appointment to develop a maintenance treatment  for allergies We will call you with the time Please work on quitting smoking. Please contact office for sooner follow up if symptoms do not improve or worsen or seek emergency care

## 2018-11-02 ENCOUNTER — Other Ambulatory Visit: Payer: Self-pay

## 2018-11-02 ENCOUNTER — Encounter: Payer: Self-pay | Admitting: Acute Care

## 2018-11-02 ENCOUNTER — Ambulatory Visit: Payer: Medicare Other | Admitting: Acute Care

## 2018-11-02 DIAGNOSIS — R9389 Abnormal findings on diagnostic imaging of other specified body structures: Secondary | ICD-10-CM

## 2018-11-02 DIAGNOSIS — F17210 Nicotine dependence, cigarettes, uncomplicated: Secondary | ICD-10-CM | POA: Diagnosis not present

## 2018-11-02 NOTE — Progress Notes (Signed)
History of Present Illness Joseph Green is a 73 y.o. male current every day smoker with a 39 pack year smoking history followed by Dr. Lake Bells for ILD.  He is also followed by the [facility].   11/02/2018 Follow Up OV: Pt. Presents for follow up for abnormal low Dose CT scan. Low Dose CT scan was read as a Lung  RADS 3, nodules that are probably benign findings, short term follow up suggested: includes nodules with a low likelihood of becoming a clinically active cancer. Radiology recommended a 6 month repeat LDCT follow up.However, there was a notation of a relatively linear endobronchial structure within the left mainstem bronchus and left upper lobe bronchus. While this could represent secretions, given similarity over multiple prior exams, a foreign body or unlikely indolent neoplastic process are in the differential. Pt. States he has been doing well and is at his baseline.He states he has sinus issues and post nasal drip that do cause him to cough 1-2 times  daily with thin secretions that are clear.He is compliant with his flonase as needed, and he takes Human resources officer daily. He does not take blood thinners, but he does take ASA 81 mg daily.He is not on any maintenance inhalers, or oxygen. He uses Flonase for nasal stuffiness  as needed. He coughs up clear mucus 1-2 times daily that he feels is due to post nasal drip.He uses nasal saline to rinse sinuses every night. He Smokes 1/2 pack per day. I have counseled him on smoking cessation.   Test Results:  10/2015 aldolase, crp, ana, scl-70, ssa/ssb, ccp, Jo-1, hypersensitivity pneumonitis all negative  PFT: August 2017 pulmonary function testing normal ratio, FEV1 3.33 L, 87% predicted, FVC 4.25 L, 83% predicted, DLCO 20.4, 54% predicted, total lung capacity not reported. February 2018 pulmonary function testing FVC 3.77 L, 73% predicted, DLCO 21.37 56% predicted March 2019 forced vital capacity 3.72 L 73% predicted, DLCO  20.16 mL 53% predicted  6MW: September 2017 6 minute walk 396.5 m, O2 saturation nadir 95% on room air 05/2016 6MW: 436 m O2 saturation nadir 97% RA 06/2017 6MW: 467m O2 saturation nadir 98% RA  LDCT: 06/2016 LR-2 Lung-RADS 3, probably benign findings. Short-term follow-up in 6 months is recommended with repeat low-dose chest CT without contrast (please use the following order, "CT CHEST LCS NODULE FOLLOW-UP W/O CM"). Relatively linear endobronchial structure within the left mainstem bronchus and left upper lobe bronchus. Although this could represent secretions, given similarity over multiple prior exams, a foreign body or unlikely indolent neoplastic process cannot be excluded. If clinically indicated, bronchoscopy may be informative. 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 Aortic atherosclerosis (ICD10-I70.0), coronary artery atherosclerosis and emphysema (ICD10-J43.9).  CBC 07/22/2007  WBC 7.2  Hemoglobin 15.3  Hematocrit 42.8  Platelets 152    BMP 07/22/2007  Glucose 109(H)  BUN 8  Creatinine 1.10  Sodium 139  Potassium 4.2  Chloride 103  CO2 27  Calcium 8.9    BNP No results found for: BNP  ProBNP No results found for: PROBNP  PFT    Component Value Date/Time   FEV1PRE 3.15 07/02/2017 0941   FEV1POST 2.86 07/02/2017 0941   FVCPRE 4.11 07/02/2017 0941   FVCPOST 3.72 07/02/2017 0941   DLCOUNC 20.16 07/02/2017 0941   PREFEV1FVCRT 77 07/02/2017 0941   PSTFEV1FVCRT 77 07/02/2017 0941    Ct Chest Lung Ca Screen Low Dose W/o Cm  Result Date: 10/23/2018 CLINICAL DATA:  40 pack-year current smoker smoking  history. Asymptomatic. EXAM: CT CHEST WITHOUT CONTRAST LOW-DOSE FOR LUNG CANCER SCREENING TECHNIQUE: Multidetector CT imaging of the chest was performed following the standard protocol without IV contrast. COMPARISON:  07/04/2017 FINDINGS: Cardiovascular: Aortic atherosclerosis. Normal heart size, without pericardial  effusion. Multivessel coronary artery atherosclerosis. Mediastinum/Nodes: No mediastinal or definite hilar adenopathy, given limitations of unenhanced CT. Lungs/Pleura: No pleural fluid. Mild centrilobular emphysema. Relatively similar appearance of upper lung and peripheral predominant reticulation and ground-glass opacity. Endobronchial linear filling defect within the left mainstem and upper lobe bronchi. This has a similar appearance and morphology on prior exams including 07/03/2016. Example image 146/3 today. Bilateral pulmonary nodules are otherwise similar, including at maximally volume derived equivalent diameter 5.7 mm in the right lower lobe. These are primarily subpleural predominant. Upper Abdomen: Cholecystectomy. Normal imaged portions of the liver, stomach, pancreas, adrenal glands, right kidney. Moderate left renal cortical thinning. Calcifications along the anterior splenic capsule are similar and likely related to remote trauma or infection. Musculoskeletal: Remote right clavicular fracture. Moderate thoracic spondylosis. IMPRESSION: 1. Lung-RADS 3, probably benign findings. Short-term follow-up in 6 months is recommended with repeat low-dose chest CT without contrast (please use the following order, "CT CHEST LCS NODULE FOLLOW-UP W/O CM"). Relatively linear endobronchial structure within the left mainstem bronchus and left upper lobe bronchus. Although this could represent secretions, given similarity over multiple prior exams, a foreign body or unlikely indolent neoplastic process cannot be excluded. If clinically indicated, bronchoscopy may be informative. 2. 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 3. Aortic atherosclerosis (ICD10-I70.0), coronary artery atherosclerosis and emphysema (ICD10-J43.9). Electronically Signed   By: Abigail Miyamoto M.D.   On: 10/23/2018 15:46     Past medical hx Past Medical History:  Diagnosis Date  . Allergy   .  Emphysema of lung ([facility])   . Environmental allergies   . Hypercholesteremia   . Hypertension   . Personal history of colonic polyps 07/12/2008     Social History   Tobacco Use  . Smoking status: Current Every Day Smoker    Packs/day: 0.50    Years: 41.00    Pack years: 20.50    Types: Cigarettes  . Smokeless tobacco: Never Used  . Tobacco comment: has cut down 0.5ppd Xpast 5 years   Substance Use Topics  . Alcohol use: No    Alcohol/week: 0.0 standard drinks  . Drug use: No    Mr.Sianez reports that he has been smoking cigarettes. He has a 20.50 pack-year smoking history. He has never used smokeless tobacco. He reports that he does not drink alcohol or use drugs.  Tobacco Cessation: Current Every Day Smoker  I have spent 3 minutes counseling patient on smoking cessation this visit. Patient verbalizes understanding of their  choice to continue smoking and the negative health consequences including worsening of COPD, risk of lung cancer , stroke and heart disease.Marland Kitchen    Past surgical hx, Family hx, Social hx all reviewed.  Current Outpatient Medications on File Prior to Visit  Medication Sig  . amLODipine (NORVASC) 10 MG tablet Take 10 mg by mouth daily.  Marland Kitchen aspirin 81 MG tablet Take 81 mg by mouth daily.  . fexofenadine (ALLEGRA) 180 MG tablet Take by mouth.  . Omega-3 Fatty Acids (FISH OIL) 1000 MG CAPS Take by mouth.  . rosuvastatin (CRESTOR) 5 MG tablet Take 10 mg by mouth at bedtime.    No current facility-administered medications on file prior to visit.      Allergies  Allergen Reactions  .  Statins Other (See Comments)    lipitor - muscle pain   . Lipitor [Atorvastatin] Other (See Comments)    Body aches    Review Of Systems:  Constitutional:   No  weight loss, night sweats,  Fevers, chills, fatigue, or  lassitude.  HEENT:   No headaches,  Difficulty swallowing,  Tooth/dental problems, or  Sore throat,                No sneezing, itching, ear ache, nasal  congestion, post nasal drip,   CV:  No chest pain,  Orthopnea, PND, swelling in lower extremities, anasarca, dizziness, palpitations, syncope.   GI  No heartburn, indigestion, abdominal pain, nausea, vomiting, diarrhea, change in bowel habits, loss of appetite, bloody stools.   Resp: No shortness of breath with exertion or at rest.  No excess mucus, no productive cough,  No non-productive cough,  No coughing up of blood.  No change in color of mucus.  No wheezing.  No chest wall deformity  Skin: no rash or lesions.  GU: no dysuria, change in color of urine, no urgency or frequency.  No flank pain, no hematuria   MS:  No joint pain or swelling.  No decreased range of motion.  No back pain.  Psych:  No change in mood or affect. No depression or anxiety.  No memory loss.   Vital Signs BP 128/70 (BP Location: Left Arm, Cuff Size: Large)   Pulse 71   Temp 98.1 F (36.7 C) (Oral)   Ht 6\' 2"  (1.88 m)   Wt 231 lb (104.8 kg)   SpO2 96%   BMI 29.66 kg/m    Physical Exam:  General- No distress,  A&Ox3, pleasant ENT: No sinus tenderness, TM clear, pale nasal mucosa, no oral exudate,no post nasal drip, no LAN Cardiac: S1, S2, regular rate and rhythm, no murmur Chest: No wheeze/ rales/ dullness; no accessory muscle use, no nasal flaring, no sternal retractions Abd.: Soft Non-tender, ND, BS +, Body mass index is 29.66 kg/m. Ext: No clubbing cyanosis, edema Neuro:  normal strength, MAE x 4, A&O x 3, appropriate Skin: No rashes,NO lesions, warm and dry Psych: normal mood and behavior   Assessment/Plan  Abnormal CT of the chest I will discuss your scan results with Dr. Valeta Harms of Dr. Lamonte Sakai  If they are in agreement that we need to do a bronchoscopy we will call you to either see the doctor or schedule the procedure. Follow up Low Dose CT in 6 months Continue Flonase for your sinus drainage Continue your Allegra daily as you have been doing. Please work on quitting smoking. Needs a  pharmacy appointment for smoking cessation at next OV.  Follow up as above  Please contact office for sooner follow up if symptoms do not improve or worsen or seek emergency care  Office Number [phone]  Cell Number for contact : [phone] (M)   Lung  RADS 3, nodules that are probably benign findings, short term follow up suggested: includes nodules with a low likelihood of becoming a clinically active cancer. Radiology recommends a 6 month repeat LDCT follow up. Plan Follow up CT Chest in 6 months.  Magdalen Spatz, NP 11/02/2018  5:50 PM

## 2018-11-02 NOTE — Assessment & Plan Note (Signed)
I will discuss your scan results with Dr. Valeta Harms of Dr. Lamonte Sakai  If they are in agreement that we need to do a bronchoscopy we will call you to either see the doctor or schedule the procedure. Follow up Low Dose CT in 6 months Continue Flonase for your sinus drainage Continue your Allegra daily as you have been doing. Please work on quitting smoking. Needs a pharmacy appointment for smoking cessation at next OV.  Follow up as above  Please contact office for sooner follow up if symptoms do not improve or worsen or seek emergency care  Office Number [phone]  Cell Number for contact : [phone] (M)

## 2018-11-02 NOTE — Patient Instructions (Addendum)
It was good to see you today. I will discuss your scan results with Dr. Valeta Harms of Dr. Lamonte Sakai  If they are in agreement that we need to do a bronchoscopy we will call you to either see the doctor or schedule the procedure. Continue Flonase for your sinus drainage Continue your Allegra daily as you have been doing. Please work on quitting smoking. Needs a pharmacy appointment for smoking cessation at next OV.  Follow up as above  Please contact office for sooner follow up if symptoms do not improve or worsen or seek emergency care  Office Number [phone]  Cell Number for contact : [phone] (M)

## 2018-11-09 ENCOUNTER — Telehealth: Payer: Self-pay | Admitting: Acute Care

## 2018-11-09 NOTE — Telephone Encounter (Signed)
Called and spoke to the patient.  Patient stated he is currently at the beach and will not be back in town for the [place], and that he will be coming home Thursday afternoon but not in time for the procedure. Patient stated he will need to reschedule bronch.  Routing to Dr. Lamonte Sakai as he was doing the bronch.

## 2018-11-09 NOTE — Telephone Encounter (Signed)
Dr. Lamonte Sakai has decided to do a bronch on pt Thursday morning. [facility] can you schedule the patient for covid testing. He may have to do it today since RB is doing the bronch on Thursday.  I called pt to advise and he said the hospital already called him and he knows to be there Thursday at [place] lives in [place] so maybe we could get a testing site close to his house. Joseph Green

## 2018-11-11 NOTE — Telephone Encounter (Signed)
Dr. Lamonte Sakai, please advise what date you want to try to get pt's bronch rescheduled for. The bronch has been cancelled which was scheduled for Thursday 7/30.

## 2018-11-12 ENCOUNTER — Ambulatory Visit (HOSPITAL_COMMUNITY): Admission: RE | Admit: 2018-11-12 | Payer: Medicare Other | Source: Home / Self Care | Admitting: Emergency Medicine

## 2018-11-12 ENCOUNTER — Encounter (HOSPITAL_COMMUNITY): Admission: RE | Payer: Self-pay | Source: Home / Self Care

## 2018-11-12 ENCOUNTER — Encounter (HOSPITAL_COMMUNITY): Payer: Self-pay

## 2018-11-12 ENCOUNTER — Encounter (HOSPITAL_COMMUNITY): Payer: Medicare Other

## 2018-11-12 ENCOUNTER — Inpatient Hospital Stay (HOSPITAL_COMMUNITY): Admission: RE | Admit: 2018-11-12 | Payer: Medicare Other | Source: Ambulatory Visit

## 2018-11-12 ENCOUNTER — Ambulatory Visit (HOSPITAL_COMMUNITY): Admit: 2018-11-12 | Payer: Medicare Other | Admitting: Emergency Medicine

## 2018-11-12 SURGERY — VIDEO BRONCHOSCOPY WITHOUT FLUORO
Anesthesia: Moderate Sedation | Laterality: Bilateral

## 2018-11-13 NOTE — Telephone Encounter (Signed)
Dr. Lamonte Sakai is back in office 11/16/2018 I will route to him and Ria Comment so they can follow up with patient when returned to office.

## 2018-11-17 NOTE — Telephone Encounter (Signed)
Dr. Lamonte Sakai, please advise on this. Thanks!

## 2018-11-17 NOTE — Telephone Encounter (Signed)
I will schedule with LL

## 2018-11-18 NOTE — Telephone Encounter (Signed)
Per RB >> dates available 8/10 ([facility]), 8/13 (Afternoon at [facility]), 8/14 ([facility]).  LMTCB x1 for pt to find out what day would work for his schedule.

## 2018-11-18 NOTE — Telephone Encounter (Signed)
Spoke with pt. He states that he does not want to do the bronch at this time. Will route message Judson Roch and RB to make them aware.

## 2018-11-26 ENCOUNTER — Telehealth: Payer: Self-pay | Admitting: Acute Care

## 2018-11-26 NOTE — Telephone Encounter (Signed)
I received a letter in the mail from Joseph Green. He had decided he did not want to proceed with the bronchoscopy we had scheduled based on the CT results ( 10/23/2018) ( Relatively linear endobronchial structure within the left mainstem bronchus and left upper lobe bronchus. Although this could represent secretions, given similarity over multiple prior exams, a foreign body or unlikely indolent neoplastic process cannot be excluded. If clinically indicated, bronchoscopy may be informative.  He sited that he would prefer to wait 6 months ( Based on Lung  RADS 3 result suggestion, nodules that are probably benign findings, short term follow up suggested: includes nodules with a low likelihood of becoming a clinically active cancer. Radiology recommends a 6 month repeat LDCT follow up.  I advised him that I prefer he did this sooner, but he wants to wait 6 months and re-evaluate.  [name], please schedule for 6 month follow up CT, which will be January 2021, and set up for a follow up appointment within a few days of the CT with either Dr. Lamonte Sakai or Icard for face to face and  bronch if clinically indicated after repeat CT scan.   Thanks so much

## 2018-11-30 NOTE — Telephone Encounter (Signed)
Order placed for 6 mth f/u low dose chest ct.  Will make a note to schedule f/u appt with By[facility] or Icard a few days after CT.

## 2019-04-26 ENCOUNTER — Other Ambulatory Visit: Payer: Self-pay

## 2019-04-26 ENCOUNTER — Ambulatory Visit (INDEPENDENT_AMBULATORY_CARE_PROVIDER_SITE_OTHER)
Admission: RE | Admit: 2019-04-26 | Discharge: 2019-04-26 | Disposition: A | Discharge status: Home / Self Care | Payer: Medicare PPO | Source: Ambulatory Visit | Attending: Acute Care | Admitting: Acute Care

## 2019-04-26 DIAGNOSIS — Z87891 Personal history of nicotine dependence: Secondary | ICD-10-CM

## 2019-04-26 DIAGNOSIS — Z122 Encounter for screening for malignant neoplasm of respiratory organs: Secondary | ICD-10-CM

## 2019-04-26 DIAGNOSIS — R918 Other nonspecific abnormal finding of lung field: Secondary | ICD-10-CM | POA: Diagnosis not present

## 2019-04-26 DIAGNOSIS — F17210 Nicotine dependence, cigarettes, uncomplicated: Secondary | ICD-10-CM

## 2019-05-06 ENCOUNTER — Other Ambulatory Visit: Payer: Self-pay | Admitting: *Deleted

## 2019-05-06 DIAGNOSIS — Z87891 Personal history of nicotine dependence: Secondary | ICD-10-CM

## 2019-05-06 DIAGNOSIS — F17210 Nicotine dependence, cigarettes, uncomplicated: Secondary | ICD-10-CM

## 2019-05-06 NOTE — Progress Notes (Signed)
Please call patient and let them  know their  low dose Ct was read as a Lung RADS 2: nodules that are benign in appearance and behavior with a very low likelihood of becoming a clinically active cancer due to size or lack of growth. Recommendation per radiology is for a repeat LDCT in 12 months. .Please let them  know we will order and schedule their  annual screening scan for 04/2020. Please let them  know there was notation of CAD on their  scan.  Please remind the patient  that this is a non-gated exam therefore degree or severity of disease  cannot be determined. Please have them  follow up with their PCP regarding potential risk factor modification, dietary therapy or pharmacologic therapy if clinically indicated. Pt.  is  currently on statin therapy. Please place order for annual  screening scan for  04/2020 and fax results to PCP. Thanks so much. 

## 2020-04-24 ENCOUNTER — Other Ambulatory Visit: Payer: Self-pay

## 2020-04-24 ENCOUNTER — Ambulatory Visit (INDEPENDENT_AMBULATORY_CARE_PROVIDER_SITE_OTHER)
Admission: RE | Admit: 2020-04-24 | Discharge: 2020-04-24 | Disposition: A | Discharge status: Home / Self Care | Payer: Medicare PPO | Source: Ambulatory Visit | Attending: Physician Assistant | Admitting: Physician Assistant

## 2020-04-24 DIAGNOSIS — F17210 Nicotine dependence, cigarettes, uncomplicated: Secondary | ICD-10-CM

## 2020-04-24 DIAGNOSIS — Z87891 Personal history of nicotine dependence: Secondary | ICD-10-CM

## 2020-04-27 NOTE — Progress Notes (Signed)
Please call patient and let them  know their  low dose Ct was read as a Lung RADS 2: nodules that are benign in appearance and behavior with a very low likelihood of becoming a clinically active cancer due to size or lack of growth. Recommendation per radiology is for a repeat LDCT in 12 months. .Please let them  know we will order and schedule their  annual screening scan for 04/2021 Please let them  know there was notation of CAD on their  scan.  Please remind the patient  that this is a non-gated exam therefore degree or severity of disease  cannot be determined. Please have them  follow up with their PCP regarding potential risk factor modification, dietary therapy or pharmacologic therapy if clinically indicated. Pt.  is  currently on statin therapy. Please place order for annual  screening scan for  04/2021 and fax results to PCP. Thanks so much. 

## 2020-05-02 ENCOUNTER — Other Ambulatory Visit: Payer: Self-pay | Admitting: *Deleted

## 2020-05-02 DIAGNOSIS — F17210 Nicotine dependence, cigarettes, uncomplicated: Secondary | ICD-10-CM

## 2020-05-02 DIAGNOSIS — Z87891 Personal history of nicotine dependence: Secondary | ICD-10-CM

## 2021-05-16 ENCOUNTER — Other Ambulatory Visit: Payer: Self-pay | Admitting: Acute Care

## 2021-05-16 DIAGNOSIS — Z87891 Personal history of nicotine dependence: Secondary | ICD-10-CM

## 2021-05-30 ENCOUNTER — Ambulatory Visit (INDEPENDENT_AMBULATORY_CARE_PROVIDER_SITE_OTHER)
Admission: RE | Admit: 2021-05-30 | Discharge: 2021-05-30 | Disposition: A | Discharge status: Home / Self Care | Payer: Medicare PPO | Source: Ambulatory Visit | Attending: Acute Care | Admitting: Acute Care

## 2021-05-30 ENCOUNTER — Other Ambulatory Visit: Payer: Self-pay

## 2021-05-30 DIAGNOSIS — Z87891 Personal history of nicotine dependence: Secondary | ICD-10-CM

## 2021-06-01 ENCOUNTER — Other Ambulatory Visit: Payer: Self-pay

## 2021-06-01 DIAGNOSIS — F17210 Nicotine dependence, cigarettes, uncomplicated: Secondary | ICD-10-CM

## 2021-06-01 DIAGNOSIS — Z87891 Personal history of nicotine dependence: Secondary | ICD-10-CM

## 2021-06-06 ENCOUNTER — Telehealth: Payer: Self-pay | Admitting: Acute Care

## 2021-06-06 NOTE — Telephone Encounter (Signed)
Called results of LDCT to patient.  No suspicious findings noted for lung cancer.  Atherosclerosis and emphysema again noted.  Patient is on a statin drug.  Also noted were a few slightly enlarged lymph nodes.   Patient was not aware of any recent illness.  PCP following blood counts for thrombocytopenia. Results of CT faxed to PCP. Patient had not further questions and verbalized understanding.  Will plan for yearly LDCT as recommended

## 2021-06-19 ENCOUNTER — Telehealth: Payer: Self-pay | Admitting: Acute Care

## 2021-06-19 NOTE — Telephone Encounter (Signed)
This patient has been seen in the past by Dr. Lake Bells. He has ILD on his CT as well as some enlarging lymph nodes. He needs a consult with one of the docs ( Byrum or Icard, MR, whoever has best availability) to be evaluated for ILD but also for enlarging lymphadenopathy ( mediastinal , and axillary nodes. ) . ?Thanks so [employer]. Patient will need to be called to schedule the consult. I have spoken with patients primary doc Roe Coombs PA), so she is aware of the plan. Thanks so much ? ?

## 2021-06-19 NOTE — Telephone Encounter (Signed)
I have called and verified the information and he has a follow up with the provider and he is aware of the appointment.  ?

## 2021-07-16 ENCOUNTER — Ambulatory Visit: Payer: Medicare PPO | Admitting: Pulmonary Disease

## 2021-07-16 ENCOUNTER — Encounter: Payer: Self-pay | Admitting: Pulmonary Disease

## 2021-07-16 VITALS — BP 134/66 | HR 74 | Temp 97.5°F | Ht 74.0 in | Wt 221.4 lb

## 2021-07-16 DIAGNOSIS — R0989 Other specified symptoms and signs involving the circulatory and respiratory systems: Secondary | ICD-10-CM

## 2021-07-16 DIAGNOSIS — Z720 Tobacco use: Secondary | ICD-10-CM | POA: Diagnosis not present

## 2021-07-16 DIAGNOSIS — F17200 Nicotine dependence, unspecified, uncomplicated: Secondary | ICD-10-CM | POA: Diagnosis not present

## 2021-07-16 DIAGNOSIS — J849 Interstitial pulmonary disease, unspecified: Secondary | ICD-10-CM | POA: Diagnosis not present

## 2021-07-16 NOTE — Patient Instructions (Addendum)
Thank you for visiting Dr. Valeta Harms at [facility]. ?Today we recommend the following: ? ?Orders Placed This Encounter  ?Procedures  ? CT CHEST HIGH RESOLUTION  ? ?ILD Questionaire to fill out and return to visit with Dr. Alfonso Patten ? ?Return for Next available new consult slot with Dr. Chase Caller, ILD . ? ? ? ?Please do your part to reduce the spread of COVID-19.  ? ?

## 2021-07-16 NOTE — Progress Notes (Signed)
? ?Synopsis: Referred in April 2023 for ILD by Aura Dials, PA-C ? ?Subjective:  ? ?PATIENT ID: Joseph Green GENDER: male DOB: 01-01-1946, MRN: [id number] ? ?Chief Complaint  ?Patient presents with  ? Consult  ?  Consult.   ? ? ?PMH of emphysema of lung, HTN, HLD, started smoking in late 1960s, still smoking 0.5 ppd.  Here today for follow-up after having recent lung cancer screening CT.  He does not use any inhalers.  He has had annual lung cancer screening CTs for some years now.  He used to work as a bus Interior and spatial designer. ? ? ? ?Past Medical History:  ?Diagnosis Date  ? Allergy   ? Emphysema of lung ([place])   ? Environmental allergies   ? Hypercholesteremia   ? Hypertension   ? Personal history of colonic polyps 07/12/2008  ?  ? ?Family History  ?Problem Relation Age of Onset  ? Heart disease Father   ? Colon cancer Neg Hx   ? Colon polyps Neg Hx   ? Esophageal cancer Neg Hx   ? Rectal cancer Neg Hx   ? Stomach cancer Neg Hx   ?  ? ?Past Surgical History:  ?Procedure Laterality Date  ? CHOLECYSTECTOMY  2010  ? [facility]  ? COLONOSCOPY W/ POLYPECTOMY  04/19/2015  ? w/Dr.Gessner  ? DOPPLER ECHOCARDIOGRAPHY  01/29/2018 at [facility]  ? EF 65-70%, Grade 1 mild diastolic dysfunction/AV normal with no regurgitation. report sent to be scan in chart  ? ? ?Social History  ? ?Socioeconomic History  ? Marital status: Divorced  ?  Spouse name: Not on file  ? Number of children: Not on file  ? Years of education: Not on file  ? Highest education level: Not on file  ?Occupational History  ? Not on file  ?Tobacco Use  ? Smoking status: Every Day  ?  Packs/day: 1.00  ?  Years: 41.00  ?  Pack years: 41.00  ?  Types: Cigarettes  ? Smokeless tobacco: Never  ? Tobacco comments:  ?  has cut down 0.5ppd Xpast 5 years   ?Vaping Use  ? Vaping Use: Never used  ?Substance and Sexual Activity  ? Alcohol use: No  ?  Alcohol/week: 0.0 standard drinks  ? Drug use: No  ? Sexual activity: Not on file  ?Other Topics Concern  ? Not on file   ?Social History Narrative  ? Not on file  ? ?Social Determinants of Health  ? ?Financial Resource Strain: Not on file  ?Food Insecurity: Not on file  ?Transportation Needs: Not on file  ?Physical Activity: Not on file  ?Stress: Not on file  ?Social Connections: Not on file  ?Intimate Partner Violence: Not on file  ?  ? ?Allergies  ?Allergen Reactions  ? Statins Other (See Comments)  ?  lipitor - muscle pain   ? Lipitor [Atorvastatin] Other (See Comments)  ?  Body aches  ?  ? ?Outpatient Medications Prior to Visit  ?Medication Sig Dispense Refill  ? amLODipine (NORVASC) 10 MG tablet Take 10 mg by mouth daily.    ? aspirin 81 MG tablet Take 81 mg by mouth daily.    ? [facility] VITAMIN B12 1000 MCG tablet Take by mouth.    ? fexofenadine (ALLEGRA) 180 MG tablet Take by mouth.    ? Omega-3 Fatty Acids (FISH OIL) 1000 MG CAPS Take by mouth.    ? pantoprazole (PROTONIX) 40 MG tablet Take by mouth.    ? rosuvastatin (  CRESTOR) 5 MG tablet Take 10 mg by mouth at bedtime.     ? ?No facility-administered medications prior to visit.  ? ? ?Review of Systems  ?Constitutional:  Negative for chills, fever, malaise/fatigue and weight loss.  ?HENT:  Negative for hearing loss, sore throat and tinnitus.   ?Eyes:  Negative for blurred vision and double vision.  ?Respiratory:  Positive for cough and shortness of breath. Negative for hemoptysis, sputum production, wheezing and stridor.   ?Cardiovascular:  Negative for chest pain, palpitations, orthopnea, leg swelling and PND.  ?Gastrointestinal:  Negative for abdominal pain, constipation, diarrhea, heartburn, nausea and vomiting.  ?Genitourinary:  Negative for dysuria, hematuria and urgency.  ?Musculoskeletal:  Negative for joint pain and myalgias.  ?Skin:  Negative for itching and rash.  ?Neurological:  Negative for dizziness, tingling, weakness and headaches.  ?Endo/Heme/Allergies:  Negative for environmental allergies. Does not bruise/bleed easily.  ?Psychiatric/Behavioral:  Negative for  depression. The patient is not nervous/anxious and does not have insomnia.   ?All other systems reviewed and are negative. ? ? ?Objective:  ?Physical Exam ?Vitals reviewed.  ?Constitutional:   ?   General: He is not in acute distress. ?   Appearance: He is well-developed.  ?HENT:  ?   Head: Normocephalic and atraumatic.  ?Eyes:  ?   General: No scleral icterus. ?   Conjunctiva/sclera: Conjunctivae normal.  ?   Pupils: Pupils are equal, round, and reactive to light.  ?Neck:  ?   Vascular: No JVD.  ?   Trachea: No tracheal deviation.  ?Cardiovascular:  ?   Rate and Rhythm: Normal rate and regular rhythm.  ?   Heart sounds: Normal heart sounds. No murmur heard. ?Pulmonary:  ?   Effort: Pulmonary effort is normal. No tachypnea, accessory muscle usage or respiratory distress.  ?   Breath sounds: No stridor. No wheezing, rhonchi or rales.  ?   Comments: Bilateral inspiratory crackles ?Abdominal:  ?   General: There is no distension.  ?   Palpations: Abdomen is soft.  ?   Tenderness: There is no abdominal tenderness.  ?Musculoskeletal:     ?   General: No tenderness.  ?   Cervical back: Neck supple.  ?Lymphadenopathy:  ?   Cervical: No cervical adenopathy.  ?Skin: ?   General: Skin is warm and dry.  ?   Capillary Refill: Capillary refill takes less than 2 seconds.  ?   Findings: No rash.  ?Neurological:  ?   Mental Status: He is alert and oriented to person, place, and time.  ?Psychiatric:     ?   Behavior: Behavior normal.  ? ? ? ?Vitals:  ? 07/16/21 0948  ?BP: 134/66  ?Pulse: 74  ?Temp: (!) 97.5 ?F (36.4 ?C)  ?TempSrc: Oral  ?SpO2: 97%  ?Weight: 221 lb 6.4 oz (100.4 kg)  ?Height: '6\' 2"'$  (1.88 m)  ? ?97% on RA ?BMI Readings from Last 3 Encounters:  ?07/16/21 28.43 kg/m?  ?11/02/18 29.66 kg/m?  ?04/30/18 30.94 kg/m?  ? ?Wt Readings from Last 3 Encounters:  ?07/16/21 221 lb 6.4 oz (100.4 kg)  ?11/02/18 231 lb (104.8 kg)  ?04/30/18 241 lb (109.3 kg)  ? ? ? ?CBC ?   ?Component Value Date/Time  ? WBC 7.2 07/22/2007 0650  ? RBC  4.57 07/22/2007 0650  ? HGB 15.3 07/22/2007 0650  ? HCT 42.8 07/22/2007 0650  ? PLT 152 07/22/2007 0650  ? MCV 93.8 07/22/2007 0650  ? MCHC 35.8 07/22/2007 0650  ? RDW 14.2 07/22/2007  5409  ? LYMPHSABS 2.4 07/22/2007 0650  ? MONOABS 0.5 07/22/2007 0650  ? EOSABS 0.1 07/22/2007 0650  ? BASOSABS 0.1 07/22/2007 0650  ? ? ? ?Chest Imaging: ?Feb 2023 LDCT  ?- Bilateral areas of peripheral ILD  ? ?Pulmonary Functions Testing Results: ? ?  Latest Ref Rng & Units 07/02/2017  ?  9:41 AM 06/03/2016  ?  3:58 PM 11/23/2015  ?  9:19 AM  ?PFT Results  ?FVC-Pre L 4.11   3.67   3.98    ?FVC-Predicted Pre % 81   72   77    ?FVC-Post L 3.72   3.77   4.25    ?FVC-Predicted Post % 73   73   83    ?Pre FEV1/FVC % % 77   78   78    ?Post FEV1/FCV % % 77   80   78    ?FEV1-Pre L 3.15   2.86   3.10    ?FEV1-Predicted Pre % 84   76   82    ?FEV1-Post L 2.86   3.00   3.33    ?DLCO uncorrected ml/min/mmHg 20.16   21.37   20.45    ?DLCO UNC% % 53   56   54    ?DLCO corrected ml/min/mmHg  21.19     ?DLCO COR %Predicted %  56     ?DLVA Predicted % 71   78   69    ? ? ?FeNO:  ? ?Pathology:  ? ?Echocardiogram:  ? ?Heart Catheterization:  ?   ?Assessment & Plan:  ? ?  ICD-10-CM   ?1. ILD (interstitial lung disease) (HCC)  J84.9 CT CHEST HIGH RESOLUTION  ?  ?2. Chest crackles  R09.89   ?  ?3. Tobacco use  Z72.0   ?  ?4. Current smoker  F17.200   ?  ? ? ?Discussion: ? ?This is a 76 year old gentleman with lung cancer screening CT found to have interstitial lung disease.  It is upper lobe predominant is got some areas of traction bronchiectasis is associated with peripheral changes.  I suspect were dealing with a respiratory bronchiolitis with ILD versus the start of DIP.  I think that he needs to quit smoking.  He was counseled heavily on this.  We will also have him complete an interstitial lung disease questionnaire and then follow-up with Dr. Chase Caller in ILD clinic. ? ?Plan: ?ILD questionnaire ?HRCT imaging, inspiratory expiratory prone and supine  imaging. ?Return to clinic, next new consult available for ILD questionnaire reviewed and HRCT reviewed with Dr. Chase Caller and ILD clinic. ? ? ?Current Outpatient Medications:  ?  amLODipine (NORVASC) 10

## 2021-07-30 ENCOUNTER — Ambulatory Visit (INDEPENDENT_AMBULATORY_CARE_PROVIDER_SITE_OTHER)
Admission: RE | Admit: 2021-07-30 | Discharge: 2021-07-30 | Disposition: A | Discharge status: Home / Self Care | Payer: Medicare PPO | Source: Ambulatory Visit | Attending: Pulmonary Disease | Admitting: Pulmonary Disease

## 2021-07-30 DIAGNOSIS — J849 Interstitial pulmonary disease, unspecified: Secondary | ICD-10-CM

## 2021-08-23 ENCOUNTER — Ambulatory Visit: Payer: Medicare PPO | Admitting: Internal Medicine

## 2021-08-23 ENCOUNTER — Encounter: Payer: Self-pay | Admitting: Internal Medicine

## 2021-08-23 VITALS — BP 130/68 | HR 64 | Temp 97.6°F | Ht 74.0 in | Wt 219.0 lb

## 2021-08-23 DIAGNOSIS — J849 Interstitial pulmonary disease, unspecified: Secondary | ICD-10-CM | POA: Diagnosis not present

## 2021-08-23 DIAGNOSIS — F17200 Nicotine dependence, unspecified, uncomplicated: Secondary | ICD-10-CM

## 2021-08-23 LAB — SEDIMENTATION RATE: Sed Rate: 29 mm/hr — ABNORMAL HIGH (ref 0–20)

## 2021-08-23 NOTE — Patient Instructions (Addendum)
ICD-10-CM   ?1. ILD (interstitial lung disease) ([place])  J84.9   ?  ?2. Current smoker  F17.200   ?  ? ?Unclear type of ILD. Could be smoking related but not sure ?Radiologist concerned is progressive ? ?Plan ? - Do ANA, RF, CCP, ESR, Quantiferon Gold blood work ? - do spirometry and dlco ? - refer Dr HEndrickson/Lightfoot of thoracic surgety for discussion of surgical lung biopsy risks ?  ? ?Followup ?- regroup with Dr Chase Caller in 6 weeks - 30 min to discuss next steps in decision making ? - surgeons biopsy (Gold standard) v bronchoscopic biopsy (lower yield) v observation/Rx ? ? ?

## 2021-08-23 NOTE — Progress Notes (Signed)
? ?IOV Aug 2017 Dr Lake Bells ? ?Joseph Green was referred from Dr. Melford Aase for an abnormal CT scan of the chest. ? ?He says that he believes that he has emphysema. ? 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  He smoked less than 1 pack per day for 41 year, currently smoking 1/2 pack per day. ? ?He notes that he has been feeling sinus congestion for a number of months, this has been going on for several months now.  The sinus congestion will drain down into his throat and make him cough.  This will get better with saline sprays at night.  He does not take any allergy medicine for this.  He doesn't report skin allergies. No itchy eyes, but he will have a scratchy throat associated with worsening congestion.  He has not tried any anti histamine.  It sounds like the congestion and cough lead to a CT scan of his chest which was abnormal.  ? 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  He was often exposed to diesel exhaust in the garage, but he never worked on [employer] directly.  He said that the buses would often run the bus in the garage. ? ?He reports no shortness of breath.  He says that he paces himself in retirement and has a little less energy, but reports no significant dyspnea with work.  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  ? 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 ? ?He notes that his house has had a minor amount of water damage around the chimney which he had repaired when he had his roof replaced about 6 months ago. He does not have a basement in his house. He does not have any hobbies such as woodworking, his main hobby is bowling. He does not hunt. He has cats that live in the house but he reports no birds or dogs. He does not have a humidifier or a  hot tub in the home. ? ?He reports having dry mouth on a daily basis but no dry eyes. He has no arthritis symptoms for redness or swelling of any joints. He reports no trouble swallowing. ? ? ? ?March 2019 followup ? ?Synopsis: Referred in 2017 for evaluation of an underlying interstitial lung disease. ? ? ?HPI ?Chief Complaint  ?Patient presents with  ? Follow-up  ?  review 69m.  pt c/o unchanged doe.    ? ?Joseph Green says he has bene active working outside.  He [place] the beach for the last few weeks and felt OK.  He goes there about once a month.  He feels that his breathing has been doing OK.  He says that sometimes if he is sitting and watching TV he will have a bad coughing spell in the mornings.  He may coiugh up some mucus in th emornings.  He says that when he coughs up mucus it is clear.  He has a lot of sinus congestion and he feels that this is the cause of the symptoms. He has been taking mucinex and allegra.  He has been taking the CVS brand of fluticasone.   ? ?He is still smoking 1/2 pack of cigarettes daily.  He  says he is interested in trying medicine to help quit. ? ? ?11/02/2018 Follow Up OV: ?Pt. Presents for follow up for abnormal low Dose CT scan. Low Dose CT scan was read as a Lung  RADS 3, nodules that are probably benign findings, short term follow up suggested: includes nodules with a low likelihood of becoming a clinically active cancer. Radiology recommended a 6 month repeat LDCT follow up.However, there was a notation of a relatively linear endobronchial structure within the left ?mainstem bronchus and left upper lobe bronchus. While this could ?represent secretions, given similarity over multiple prior exams, a ?foreign body or unlikely indolent neoplastic process are in the differential. Pt. States he has been doing well and is at his baseline.He states he has sinus issues and post nasal drip that do cause him to cough 1-2 times  daily with thin secretions that are clear.He is  compliant with his flonase as needed, and he takes Human resources officer daily. He does not take blood thinners, but he does take ASA 81 mg daily.He is not on any maintenance inhalers, or oxygen. He uses Flonase for nasal stuffiness  as needed. He coughs up clear mucus 1-2 times daily that he feels is due to post nasal drip.He uses nasal saline to rinse sinuses every night. He Smokes 1/2 pack per day. I have counseled him on smoking cessation.  ? ?Test Results: ?  ?10/2015 aldolase, crp, ana, scl-70, ssa/ssb, ccp, Jo-1, hypersensitivity pneumonitis all negative ?  ?PFT: ?August 2017 pulmonary function testing normal ratio, FEV1 3.33 L, 87% predicted, FVC 4.25 L, 83% predicted, DLCO 20.4, 54% predicted, total lung capacity not reported. ?February 2018 pulmonary function testing FVC 3.77 L, 73% predicted, DLCO 21.37 56% predicted ?March 2019 forced vital capacity 3.72 L 73% predicted, DLCO 20.16 mL 53% predicted ?  ?6MW: ?September 2017 6 minute walk 396.5 m, O2 saturation nadir 95% on room air ?05/2016 6MW: 436 m O2 saturation nadir 97% RA ?06/2017 6MW: 472mO2 saturation nadir 98% RA ? ?LDCT: ?06/2016 ?LR-2 ?Lung-RADS 3, probably benign findings. Short-term follow-up in 6 ?months is recommended with repeat low-dose chest CT without contrast ?(please use the following order, "CT CHEST LCS NODULE FOLLOW-UP W/O ?CM"). Relatively linear endobronchial structure within the left ?mainstem bronchus and left upper lobe bronchus. Although this could ?represent secretions, given similarity over multiple prior exams, a ?foreign body or unlikely indolent neoplastic process cannot be ?excluded. If clinically indicated, bronchoscopy may be informative. 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 ?Aortic atherosclerosis (ICD10-I70.0), coronary artery ?atherosclerosis and emphysema (([id number] ? ? ?April 2023 Icard - new consult ? ? ?This is a 76year old gentleman with lung cancer screening CT found to have  interstitial lung disease.  It is upper lobe predominant is got some areas of traction bronchiectasis is associated with peripheral changes.  I suspect were dealing with a respiratory bronchiolitis with ILD versus the start of DIP.  I think that he needs to quit smoking.  He was counseled heavily on this.  We will also have him complete an interstitial lung disease questionnaire and then follow-up with Dr. RChase Callerin ILD clinic. ? ?Plan: ?ILD questionnaire ?HRCT imaging, inspiratory expiratory prone and supine imaging. ?Return to clinic, next new consult available for ILD questionnaire reviewed and HRCT reviewed with Dr. RChase Callerand ILD clinic. ? ? ?OV 08/23/2021 -referred by Dr. BLeory PlowmanIcard to the ILD center with Dr. RChase Caller? ?Subjective:  ?Patient ID: Joseph Green male , DOB: [birth year], age 76y.o. ,  MRN: [id number] , ADDRESS: [address] [facility] [facility] [facility] [address] ?[place] [id number] ?PCP Aura Dials, PA-C ?Patient Care Team: ?Selinda Orion as PCP - General (Physician Assistant) ? ?This Provider for this visit: Treatment Team:  ?Attending Provider: Brand Males, MD ? ? ? ?08/23/2021 -   ?Chief Complaint  ?Patient presents with  ? Follow-up  ?  Pt switching from Dr. Valeta Harms to MR for care of ILD.  Pt states he has been okay since last visit. Will become SOB when he overexerts himself.  ? ? ? ?HPI ?Joseph Green 76 y.o. -in 2017-2019 was a patient of Dr. Lake Bells.  Last seen by nurse practitioner in 2020.  After that has reestablished with Dr. June Leap.  He had a lung cancer screening CT which showed ILD this then resulted in high-resolution CT chest.  He already has had ILD and he was being followed along.  However when I asked him he told me that he used to see Dr. Lake Bells once a year for CT scan and he was told he had emphysema nodules.  And he said recently that the cause concerned the nodules had increased.  But then primary care physician got excited by his CT scan and had him referred to Dr.  Valeta Harms and therefore is here.  He says he is not aware of the time of ILD or IPF or pulmonary fibrosis.  In the remote past he believes Dr. Lake Bells might of mention this but he is not sure. ? ? ? ? ?[place]

## 2021-08-25 LAB — QUANTIFERON-TB GOLD PLUS
Mitogen-NIL: >10 IU/mL
NIL: 1.02 IU/mL
QuantiFERON-TB Gold Plus: NEGATIVE
TB1-NIL: <0 IU/mL
TB2-NIL: <0 IU/mL

## 2021-08-25 LAB — CYCLIC CITRUL PEPTIDE ANTIBODY, IGG: Cyclic Citrullin Peptide Ab: <16 UNITS

## 2021-08-25 LAB — ANA: Anti Nuclear Antibody (ANA): NEGATIVE

## 2021-08-25 LAB — RHEUMATOID FACTOR: Rheumatoid fact SerPl-aCnc: <14 IU/mL (ref <14)

## 2021-09-18 ENCOUNTER — Encounter: Payer: Medicare PPO | Admitting: Thoracic Surgery (Cardiothoracic Vascular Surgery)

## 2021-10-04 ENCOUNTER — Encounter: Payer: Medicare PPO | Admitting: Thoracic Surgery (Cardiothoracic Vascular Surgery)

## 2021-11-13 ENCOUNTER — Institutional Professional Consult (permissible substitution): Payer: Medicare PPO | Admitting: Thoracic Surgery (Cardiothoracic Vascular Surgery)

## 2021-11-13 VITALS — BP 145/71 | HR 88 | Resp 18 | Ht 74.0 in | Wt 207.0 lb

## 2021-11-13 DIAGNOSIS — J849 Interstitial pulmonary disease, unspecified: Secondary | ICD-10-CM | POA: Diagnosis not present

## 2021-11-13 NOTE — Progress Notes (Signed)
PCP is Selinda Orion Referring Provider is Brand Males, MD  Chief Complaint  Patient presents with   Interstitial Lung Disease    CT lung 2/15, CT high resolution 4/17    HPI: Joseph Green is sent for consultation for possible lung biopsy for interstitial lung disease.  Joseph Green is a 76 year old man with a history of tobacco abuse, emphysema, hypertension, hyperlipidemia, and colonic polyps.  He smoked about a pack a day for 40 years.  He currently is smoking 1/2 pack/day.  He has not tried to stop altogether.  He has had an abnormal CT of the chest dating back to at least August 2017 when he saw Dr. Lake Bells.  He followed him for a while and monitor pulmonary function test as well.  He recently saw Dr. Chase Caller regarding his interstitial lung disease.  A CT of the chest showed ILD and some right hilar and mediastinal adenopathy.  He referred him for consideration for surgical lung biopsy.  Joseph Green does get short of breath with heavy exertion but not at baseline.  Smoking 1/2 pack cigarettes a day.  He has lost about 6 pounds over the past 3 months without really trying.  No chest pain, pressure, or tightness.   Past Medical History:  Diagnosis Date   Allergy    Emphysema of lung ([place])    Environmental allergies    Hypercholesteremia    Hypertension    Personal history of colonic polyps 07/12/2008    Past Surgical History:  Procedure Laterality Date   CHOLECYSTECTOMY  2010   [facility]   COLONOSCOPY W/ POLYPECTOMY  04/19/2015   w/Dr.Gessner   DOPPLER ECHOCARDIOGRAPHY  01/29/2018 at [facility]   EF 65-70%, Grade 1 mild diastolic dysfunction/AV normal with no regurgitation. report sent to be scan in chart    Family History  Problem Relation Age of Onset   Heart disease Father    Colon cancer Neg Hx    Colon polyps Neg Hx    Esophageal cancer Neg Hx    Rectal cancer Neg Hx    Stomach cancer Neg Hx     Social History Social History   Tobacco Use   Smoking  status: Every Day    Packs/day: 1.00    Years: 41.00    Total pack years: 41.00    Types: Cigarettes   Smokeless tobacco: Never   Tobacco comments:    Currently smoking 0.5ppd as of 08/23/21  Vaping Use   Vaping Use: Never used  Substance Use Topics   Alcohol use: No    Alcohol/week: 0.0 standard drinks of alcohol   Drug use: No    Current Outpatient Medications  Medication Sig Dispense Refill   amLODipine (NORVASC) 10 MG tablet Take 10 mg by mouth daily.     aspirin 81 MG tablet Take 81 mg by mouth daily.     [employer] VITAMIN B12 1000 MCG tablet Take by mouth.     fexofenadine (ALLEGRA) 180 MG tablet Take by mouth.     Omega-3 Fatty Acids (FISH OIL) 1000 MG CAPS Take by mouth.     pantoprazole (PROTONIX) 40 MG tablet Take by mouth.     rosuvastatin (CRESTOR) 5 MG tablet Take 10 mg by mouth at bedtime.      No current facility-administered medications for this visit.    Allergies  Allergen Reactions   Statins Other (See Comments)    lipitor - muscle pain    Lipitor [Atorvastatin] Other (See Comments)  Body aches    Review of Systems  Constitutional:  Positive for unexpected weight change (has lost 6 pounds in 3 months). Negative for activity change.  HENT:  Negative for trouble swallowing and voice change.   Respiratory:  Positive for cough (Productive, white phlegm). Negative for shortness of breath and wheezing.   Cardiovascular:  Negative for chest pain and leg swelling.  Psychiatric/Behavioral:  The patient is nervous/anxious.     BP (!) 145/71 (BP Location: Left Arm, Patient Position: Sitting)   Pulse 88   Resp 18   Ht '6\' 2"'$  (1.88 m)   Wt 207 lb (93.9 kg)   SpO2 96% Comment: RA  BMI 26.58 kg/m  Physical Exam Vitals reviewed.  Constitutional:      General: He is not in acute distress.    Appearance: Normal appearance.  HENT:     Head: Normocephalic and atraumatic.  Eyes:     General: No scleral icterus.    Extraocular Movements: Extraocular movements  intact.  Cardiovascular:     Rate and Rhythm: Normal rate and regular rhythm.     Heart sounds: Normal heart sounds. No murmur heard. Pulmonary:     Effort: Pulmonary effort is normal. No respiratory distress.     Breath sounds: No wheezing.     Comments: Coarse breath sounds bilaterally Abdominal:     General: There is no distension.     Palpations: Abdomen is soft.  Musculoskeletal:     Cervical back: Neck supple.  Lymphadenopathy:     Cervical: No cervical adenopathy.  Skin:    General: Skin is warm and dry.  Neurological:     General: No focal deficit present.     Mental Status: He is alert and oriented to person, place, and time.     Cranial Nerves: No cranial nerve deficit.     Motor: No weakness.    Diagnostic Tests: CT CHEST WITHOUT CONTRAST   TECHNIQUE: Multidetector CT imaging of the chest was performed following the standard protocol without intravenous contrast. High resolution imaging of the lungs, as well as inspiratory and expiratory imaging, was performed.   RADIATION DOSE REDUCTION: This exam was performed according to the departmental dose-optimization program which includes automated exposure control, adjustment of the mA and/or kV according to patient size and/or use of iterative reconstruction technique.   COMPARISON:  Multiple prior exams, most recent lung cancer screening CT dated May 30, 2021   FINDINGS: Cardiovascular: Normal heart size. No pericardial effusion. Three-vessel coronary artery calcifications. Atherosclerotic disease of the thoracic aorta.   Mediastinum/Nodes: Esophagus and thyroid are unremarkable. Interval increased size of mediastinal hilar lymph nodes. Subcarinal lymph node measuring 1.2 cm in short axis on series 2, image 77, previously 1.1 cm. Right hilar lymph node measuring 1.4 cm in short axis on series 2, image 65, previously 1.3 cm.   Lungs/Pleura: Central airways are patent. No significant air trapping.  Peripheral and peribronchovascular reticular and ground-glass opacities within upper lobe predominant distribution. Associated traction bronchiectasis and honeycomb change of the left upper lobe. Findings have progressed when compared with April 24, 2020 prior exam, particularly in the left upper lobe. Stable small bilateral solid pulmonary nodules. Largest is a solid nodule in the right upper lobe measuring 6 mm in mean diameter on series 5, image 74.   Upper Abdomen: Cholecystectomy clips.  No acute abnormality.   Musculoskeletal: No chest wall mass or suspicious bone lesions identified.   IMPRESSION: 1. Peripheral and peribronchovascular ground-glass and reticular opacities with  an upper lobe predominant distribution, favor smoking related interstitial lung disease. Findings have progressed when compared with April 24, 2020 exam. Findings are suggestive of an alternative diagnosis (not UIP) per consensus guidelines: Diagnosis of Idiopathic Pulmonary Fibrosis: An Official ATS/ERS/JRS/ALAT Clinical Practice Guideline. [place], Iss 5, [phone], Dec 14 2016. 2. Enlarged mediastinal and hilar lymph nodes. Potentially reactive, although nodes have increased in size when compared with most recent prior exam and prior exams dating back to April 24, 2020. Recommend PET-CT for further evaluation. 3. Aortic Atherosclerosis (ICD10-I70.0) and Emphysema (ICD10-J43.9).     Electronically Signed   By: Yetta Glassman M.D.   On: 07/31/2021 18:13 I personally reviewed the CT images.  There is upper lobe predominant groundglass and reticular opacities.  Also enlarged right hilar and mediastinal lymph nodes.  Impression: Joseph Green is a 76 year old male with a history of tobacco abuse, ILD, emphysema, hypertension, and hyperlipidemia.  He has been followed by pulmonology dating back to 2017.  Earlier this spring he had a CT which showed ILD upper lobe predominant  not consistent with UIP.  He also had some right hilar and subcarinal adenopathy.  He was referred first to Dr. Jamey Ripa and then to Dr. Chase Caller.  I think it would be reasonable to do a lung biopsy for more definitive diagnosis of the underlying ILD process.  He is a good candidate for the procedure.  If he were to choose to do so would plan to do it through the right chest so that we could access the subcarinal and/or right hilar nodes.  Those nodes appear benign but confirmation would be nice.  I discussed the proposed procedure with Joseph Green.  He understands would be done in the operating room under general anesthesia and would require a short hospital stay.  I informed him of the general nature of the procedure including the need for general anesthesia, the incisions to be used, the use of the surgical robot, the use of a drainage tube postoperatively, the expected hospital stay, and the overall recovery.  I informed him of the indications, risks, benefits, and alternatives.  He understands the risks include, but not limited to death, MI, DVT, PE, bleeding, possible need for transfusion, infection, prolonged air leak, irregular heart rhythms, as well as possibility of other unforeseeable complications.  He understands the risks of this type of procedure are low but not 0.  He is not certain is whether he would like to proceed.  He wants some time to think this over.  He is scheduled to see Dr. Chase Caller again later this month.  We will likely wait until after he has had a chance to talk with him more before making a final decision.  Plan: Patient will call if he would like to proceed with right VATS for lung biopsy and lymph node sampling.  Melrose Nakayama, MD [facility] [phone]

## 2021-11-29 ENCOUNTER — Emergency Department (HOSPITAL_COMMUNITY)
Admission: EM | Admit: 2021-11-29 | Discharge: 2021-11-29 | Disposition: A | Discharge status: Home / Self Care | Payer: Medicare PPO | Attending: Student | Admitting: Student

## 2021-11-29 ENCOUNTER — Other Ambulatory Visit: Payer: Self-pay

## 2021-11-29 ENCOUNTER — Encounter (HOSPITAL_COMMUNITY): Payer: Self-pay

## 2021-11-29 DIAGNOSIS — Z7982 Long term (current) use of aspirin: Secondary | ICD-10-CM | POA: Insufficient documentation

## 2021-11-29 DIAGNOSIS — Z87891 Personal history of nicotine dependence: Secondary | ICD-10-CM | POA: Insufficient documentation

## 2021-11-29 DIAGNOSIS — F419 Anxiety disorder, unspecified: Secondary | ICD-10-CM | POA: Insufficient documentation

## 2021-11-29 DIAGNOSIS — I10 Essential (primary) hypertension: Secondary | ICD-10-CM | POA: Diagnosis not present

## 2021-11-29 DIAGNOSIS — Z79899 Other long term (current) drug therapy: Secondary | ICD-10-CM | POA: Diagnosis not present

## 2021-11-29 HISTORY — DX: Anxiety disorder, unspecified: F41.9

## 2021-11-29 MED ORDER — HYDROXYZINE HCL 25 MG PO TABS: 25.0000 mg | ORAL_TABLET | Freq: Four times a day (QID) | ORAL | 0 refills | Status: DC

## 2021-11-29 NOTE — ED Triage Notes (Addendum)
Pt reports anxiety and restlessness that started Wednesday night, pt says he has a history of this and is taking buspar and uses nicotine patch- started both of these in August when he quit smoking and hasn't had anxiety attack since then, until yesterday, pt denies any new stress or change in medications. Pt denies chest pain, had some nausea earlier, now subsided.

## 2021-11-30 ENCOUNTER — Ambulatory Visit: Payer: Medicare PPO | Admitting: Internal Medicine

## 2021-11-30 ENCOUNTER — Encounter: Payer: Self-pay | Admitting: Internal Medicine

## 2021-11-30 VITALS — BP 120/60 | HR 83 | Ht 74.0 in | Wt 201.8 lb

## 2021-11-30 DIAGNOSIS — Z87891 Personal history of nicotine dependence: Secondary | ICD-10-CM | POA: Diagnosis not present

## 2021-11-30 DIAGNOSIS — J849 Interstitial pulmonary disease, unspecified: Secondary | ICD-10-CM

## 2021-11-30 DIAGNOSIS — R590 Localized enlarged lymph nodes: Secondary | ICD-10-CM

## 2021-11-30 DIAGNOSIS — J439 Emphysema, unspecified: Secondary | ICD-10-CM | POA: Diagnosis not present

## 2021-11-30 MED ORDER — SPIRIVA RESPIMAT 1.25 MCG/ACT IN AERS: 2.0000 | INHALATION_SPRAY | Freq: Every day | RESPIRATORY_TRACT | 0 refills | Status: DC

## 2021-11-30 NOTE — Patient Instructions (Addendum)
ILD (interstitial lung disease) ([place])  -We do not know what variety you have but suspect it might not be variety called IPF\ -Radiologist concerned it is progressive over time -Respect declining having lung biopsy after meeting Dr. Roxan Hockey in May 2023 -Respect declining to take antifibrotic's on the basis of progression -Noted that he did not do spirometry and DLCO this visit because of anxiety  Plan - Do spirometry and DLCO in 2-3 months -Simple walking desaturation test at follow-up in 2-3 months   Mediastinal adenopathy -chronic and noted but reported a slightly enlarged in April 2023 CT chest  Noted declining to have biopsy procedure after meeting Dr. Roxan Hockey May 2023  Plan  - Supportive shared decision making to have clinical follow-up and not wanting PET scan at this visit - will discuss again at next visit   Quit smoking -August 2023  Congratulations.  Discussed quit smoking for 3 minutes  Plan - Continue nicotine patch - Respect declining Zyban and also Wellbutrin  Pulmonary emphysema, unspecified emphysema type ([place])   -This has been noticed on CT scan and it is mild  Plan - Try empiric Spiriva inhaler and see if this improves her shortness of breath  Follow-up - Return in 2-3 months but after breathing test

## 2021-11-30 NOTE — ED Provider Notes (Signed)
[facility] EMERGENCY DEPARTMENT Provider Note  CSN: [id number] Arrival date & time: 11/29/21 1844  Chief Complaint(s) Anxiety  HPI Joseph Green is a 76 y.o. male with PMH anxiety, emphysema, HTN, and HLD who presents emergency department for evaluation of anxiety.  He states that he was prescribed BuSpar and was taking it as needed for anxiety, but he feels that he had an anxiety attack starting yesterday that will not resolve.  He is unsure as to why he is more anxious today compared to usual but is up and pacing the room requesting to be discharged on medicine for anxiety.  He denies illicit substance use, denies chest pain, shortness of breath, abdominal pain, nausea, vomiting or other systemic symptoms.  Denies suicidal or abdominal ideations.  Denies auditory or visual hallucinations.   Past Medical History Past Medical History:  Diagnosis Date   Allergy    Anxiety    Emphysema of lung ([place])    Environmental allergies    Hypercholesteremia    Hypertension    Personal history of colonic polyps 07/12/2008   Patient Active Problem List   Diagnosis Date Noted   Abnormal CT of the chest 11/02/2018   Cigarette smoker 06/05/2016   ILD (interstitial lung disease) ([place]) 11/14/2015   Allergic rhinitis 11/14/2015   Personal history of colonic polyps 07/12/2008   Home Medication(s) Prior to Admission medications   Medication Sig Start Date End Date Taking? Authorizing Provider  amLODipine (NORVASC) 10 MG tablet Take 10 mg by mouth daily.    [provider]  aspirin 81 MG tablet Take 81 mg by mouth daily.    [provider]  [employer]S VITAMIN B12 1000 MCG tablet Take by mouth. 05/09/21   [provider]  fexofenadine (ALLEGRA) 180 MG tablet Take by mouth. 06/23/17   [provider]  hydrOXYzine (ATARAX) 25 MG tablet Take 1 tablet (25 mg total) by mouth every 6 (six) hours. 11/29/21   Pansey Pinheiro, MD  Omega-3 Fatty Acids (FISH OIL) 1000 MG CAPS Take by  mouth.    [provider]  pantoprazole (PROTONIX) 40 MG tablet Take by mouth. 06/18/21   [provider]  rosuvastatin (CRESTOR) 5 MG tablet Take 10 mg by mouth at bedtime.     [provider]                                                                                                                                    Past Surgical History Past Surgical History:  Procedure Laterality Date   CHO[facility]Y  2010   [facility]   COLONOSCOPY W/ POLYPECTOMY  04/19/2015   w/Dr.Gessner   DOPPLER ECHOCARDIOGRAPHY  01/29/2018 at [facility]   EF 65-70%, Grade 1 mild diastolic dysfunction/AV normal with no regurgitation. report sent to be scan in chart   Family History Family History  Problem Relation Age of Onset   Heart disease Father    Colon  cancer Neg Hx    Colon polyps Neg Hx    Esophageal cancer Neg Hx    Rectal cancer Neg Hx    Stomach cancer Neg Hx     Social History Social History   Tobacco Use   Smoking status: Former    Years: 41.00    Types: Cigarettes    Quit date: 11/29/2021   Smokeless tobacco: Never   Tobacco comments:    Currently smoking 0.5ppd as of 08/23/21  Vaping Use   Vaping Use: Never used  Substance Use Topics   Alcohol use: No    Alcohol/week: 0.0 standard drinks of alcohol   Drug use: No   Allergies Statins and Lipitor [atorvastatin]  Review of Systems Review of Systems  Psychiatric/Behavioral:  The patient is nervous/anxious.     Physical Exam Vital Signs  I have reviewed the triage vital signs BP (!) 154/86   Pulse 88   Temp 98 F (36.7 C) (Oral)   Resp 20   Ht '6\' 2"'$  (1.88 m)   Wt 93.8 kg   SpO2 98%   BMI 26.55 kg/m   Physical Exam Constitutional:      General: He is not in acute distress.    Appearance: Normal appearance.  HENT:     Head: Normocephalic and atraumatic.     Nose: No congestion or rhinorrhea.  Eyes:     General:        Right eye: No discharge.        Left eye: No discharge.     Extraocular  Movements: Extraocular movements intact.     Pupils: Pupils are equal, round, and reactive to light.  Cardiovascular:     Rate and Rhythm: Normal rate and regular rhythm.     Heart sounds: No murmur heard. Pulmonary:     Effort: No respiratory distress.     Breath sounds: No wheezing or rales.  Abdominal:     General: There is no distension.     Tenderness: There is no abdominal tenderness.  Musculoskeletal:        General: Normal range of motion.     Cervical back: Normal range of motion.  Skin:    General: Skin is warm and dry.  Neurological:     General: No focal deficit present.     Mental Status: He is alert.  Psychiatric:     Comments: Anxious in appearance     ED Results and Treatments Labs (all labs ordered are listed, but only abnormal results are displayed) Labs Reviewed - No data to display                                                                                                                        Radiology No results found.  Pertinent labs & imaging results that were available during my care of the patient were reviewed by me and considered in my medical decision making (see MDM for details).  Medications Ordered in ED Medications -  No data to display                                                                                                                                   Procedures Procedures  (including critical care time)  Medical Decision Making / ED Course   This patient presents to the ED for concern of anxiety, this involves an extensive number of treatment options, and is a complaint that carries with it a high risk of complications and morbidity.  The differential diagnosis includes anxiety, illicit substance use excessive caffeine intake panic disorder, electrolyte abnormality, hyperthyroidism  MDM: Patient seen emergency department for evaluation of anxiety.  Physical exam is unremarkable.  Patient on my exam is at the door  requesting to be discharged soon as possible and would like to forego any additional laboratory evaluation.  Patient then discharged on Atarax and encouraged to follow-up with his primary care physician.  He is not endorsing homicidal or suicidal ideation and does not display evidence of psychosis at this time and he is safe for discharge.   Additional history obtained:  -External records from outside source obtained and reviewed including: Chart review including previous notes, labs, imaging, consultation notes     Medicines ordered and prescription drug management: Meds ordered this encounter  Medications   DISCONTD: hydrOXYzine (ATARAX) 25 MG tablet    Sig: Take 1 tablet (25 mg total) by mouth every 6 (six) hours.    Dispense:  12 tablet    Refill:  0   hydrOXYzine (ATARAX) 25 MG tablet    Sig: Take 1 tablet (25 mg total) by mouth every 6 (six) hours.    Dispense:  12 tablet    Refill:  0    -I have reviewed the patients home medicines and have made adjustments as needed  Critical interventions none   Reevaluation: After the interventions noted above, I reevaluated the patient and found that they have :stayed the same  Co morbidities that complicate the patient evaluation  Past Medical History:  Diagnosis Date   Allergy    Anxiety    Emphysema of lung ([place])    Environmental allergies    Hypercholesteremia    Hypertension    Personal history of colonic polyps 07/12/2008      Dispostion: I considered admission for this patient, but he does not meet inpatient criteria for admission he is safe for discharge with outpatient follow-up     Final Clinical Impression(s) / ED Diagnoses Final diagnoses:  Anxiety     '@PCDICTATION'$ @    Teressa Lower, MD 11/30/21 1233

## 2021-11-30 NOTE — Progress Notes (Signed)
IOV Aug 2017 Dr Lake Bells  Mr. Speros was referred from Dr. Melford Aase for an abnormal CT scan of the chest.  He says that he believes that he has emphysema.  He started smoking in 1967 when he was in the service.  He smoked less than 1 pack per day for 41 year, currently smoking 1/2 pack per day.  He notes that he has been feeling sinus congestion for a number of months, this has been going on for several months now.  The sinus congestion will drain down into his throat and make him cough.  This will get better with saline sprays at night.  He does not take any allergy medicine for this.  He doesn't report skin allergies. No itchy eyes, but he will have a scratchy throat associated with worsening congestion.  He has not tried any anti histamine.  It sounds like the congestion and cough lead to a CT scan of his chest which was abnormal.   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  He was often exposed to diesel exhaust in the garage, but he never worked on [employer] directly.  He said that the buses would often run the bus in the garage.  He reports no shortness of breath.  He says that he paces himself in retirement and has a little less energy, but reports no significant dyspnea with work.  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  He notes that his house has had a minor amount of water damage around the chimney which he had repaired when he had his roof replaced about 6 months ago. He does not have a basement in his house. He does not have any hobbies such as woodworking, his main hobby is bowling. He does not hunt. He has cats that live in the house but he reports no birds or dogs. He does not have a humidifier or a  hot tub in the home.  He reports having dry mouth on a daily basis but no dry eyes. He has no arthritis symptoms for redness or swelling of any joints. He reports no trouble swallowing.    March 2019 followup  Synopsis: Referred in 2017 for evaluation of an underlying interstitial lung disease.   HPI Chief Complaint  Patient presents with   Follow-up    review 4mw.  pt c/o unchanged doe.     Emmanual says he has bene active working outside.  He was staying at the beach for the last few weeks and felt OK.  He goes there about once a month.  He feels that his breathing has been doing OK.  He says that sometimes if he is sitting and watching TV he will have a bad coughing spell in the mornings.  He may coiugh up some mucus in th emornings.  He says that when he coughs up mucus it is clear.  He has a lot of sinus congestion and he feels that this is the cause of the symptoms. He has been taking mucinex and allegra.  He has been taking the [facility] brand of fluticasone.    He is still smoking 1/2 pack of cigarettes daily.  He says he is interested in trying medicine to help quit.   11/02/2018 Follow Up OV: Pt. Presents for follow up for abnormal low Dose CT scan. Low Dose CT scan was read as a Lung  RADS 3, nodules that are probably benign findings, short term follow up suggested: includes nodules with a low likelihood of becoming a clinically active cancer. Radiology recommended a 6 month repeat LDCT follow up.However, there was a notation of a relatively linear endobronchial structure within the left mainstem bronchus and left upper lobe bronchus. While this could represent secretions, given similarity over multiple prior exams, a foreign body or unlikely indolent neoplastic process are in the differential. Pt. States he has been doing well and is at his baseline.He states he has sinus issues and post nasal drip that do cause him to cough 1-2 times  daily with thin secretions that are clear.He is  compliant with his flonase as needed, and he takes Human resources officer daily. He does not take blood thinners, but he does take ASA 81 mg daily.He is not on any maintenance inhalers, or oxygen. He uses Flonase for nasal stuffiness  as needed. He coughs up clear mucus 1-2 times daily that he feels is due to post nasal drip.He uses nasal saline to rinse sinuses every night. He Smokes 1/2 pack per day. I have counseled him on smoking cessation.   Test Results:   10/2015 aldolase, crp, ana, scl-70, ssa/ssb, ccp, Jo-1, hypersensitivity pneumonitis all negative   PFT: August 2017 pulmonary function testing normal ratio, FEV1 3.33 L, 87% predicted, FVC 4.25 L, 83% predicted, DLCO 20.4, 54% predicted, total lung capacity not reported. February 2018 pulmonary function testing FVC 3.77 L, 73% predicted, DLCO 21.37 56% predicted March 2019 forced vital capacity 3.72 L 73% predicted, DLCO 20.16 mL 53% predicted   6MW: September 2017 6 minute walk 396.5 m, O2 saturation nadir 95% on room air 05/2016 6MW: 436 m O2 saturation nadir 97% RA 06/2017 6MW: 469m O2 saturation nadir 98% RA  LDCT: 06/2016 LR-2 Lung-RADS 3, probably benign findings. Short-term follow-up in 6 months is recommended with repeat low-dose chest CT without contrast (please use the following order, "CT CHEST LCS NODULE FOLLOW-UP W/O CM"). Relatively linear endobronchial structure within the left mainstem bronchus and left upper lobe bronchus. Although this could represent secretions, given similarity over multiple prior exams, a foreign body or unlikely indolent neoplastic process cannot be excluded. If clinically indicated, bronchoscopy may be informative. 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 Aortic atherosclerosis (ICD10-I70.0), coronary artery atherosclerosis and emphysema ([id number].   April 2023 Icard - new consult   This is a 76 year old gentleman with lung cancer screening CT found to have  interstitial lung disease.  It is upper lobe predominant is got some areas of traction bronchiectasis is associated with peripheral changes.  I suspect were dealing with a respiratory bronchiolitis with ILD versus the start of DIP.  I think that he needs to quit smoking.  He was counseled heavily on this.  We will also have him complete an interstitial lung disease questionnaire and then follow-up with Dr. Chase Caller in ILD clinic.  Plan: ILD questionnaire HRCT imaging, inspiratory expiratory prone and supine imaging. Return to clinic, next new consult available for ILD questionnaire reviewed and HRCT reviewed with Dr. Chase Caller and ILD clinic.   OV 08/23/2021 -referred by Dr. June Leap to the ILD center with Dr. Chase Caller  Subjective:  Patient ID: Joseph Green, male , DOB: 24-Oct-1945 , age 76  y.o. , MRN: [id number] , ADDRESS: [place] [id number] PCP Aura Dials, PA-C Patient Care Team: Selinda Orion as PCP - General (Physician Assistant)  This Provider for this visit: Treatment Team:  Attending Provider: Brand Males, MD    08/23/2021 -   Chief Complaint  Patient presents with   Follow-up    Pt switching from Dr. Valeta Harms to MR for care of ILD.  Pt states he has been okay since last visit. Will become SOB when he overexerts himself.     HPI Joseph Green 76 y.o. -in 2017-2019 was a patient of Dr. Lake Bells.  Last seen by nurse practitioner in 2020.  After that has reestablished with Dr. June Leap.  He had a lung cancer screening CT which showed ILD this then resulted in high-resolution CT chest.  He already has had ILD and he was being followed along.  However when I asked him he told me that he used to see Dr. Lake Bells once a year for CT scan and he was told he had emphysema nodules.  And he said recently that the cause concerned the nodules had increased.  But then primary care physician got excited by his CT scan and had him referred to Dr.  Valeta Harms and therefore is here.  He says he is not aware of the time of ILD or IPF or pulmonary fibrosis.  In the remote past he believes Dr. Lake Bells might of mention this but he is not sure.     [place] [facility] Comprehensive ILD Questionnaire  Symptoms:  = He has dyspnea on exertion that is of insidious onset.  Since it started at the same.  He is not aware when it started.  He thinks maybe the last 6 months to 1 year.  He also has chronic cough for the last 6 months to 1 year and this might be slightly worse.  He does have some mild sputum production.  Symptom severity is as below.     Past Medical History :  -He believes he might have COPD.  Otherwise answered negative - Has had COVID-vaccine but not COVID   ROS:  -Positive for arthralgia but otherwise denied all other questions  FAMILY HISTORY of LUNG DISEASE:  Denies*  PERSONAL EXPOSURE HISTORY:  Started smoking 1967.  Still smokes around half pack a day.  He is also smoked pipes in the past.  He has never smoked marijuana or cocaine or intravenous drug use.  HOME  EXPOSURE and HOBBY DETAILS :  -Single-family home for the last 25 years.  Age of the home is 26 years.  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  But he has been retired for 10 years.  He used to work there for 20 years.  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  He during this time used to get exposed from diesel smoke from the schoolbus.  PULMONARY TOXICITY HISTORY (27 items):  -Denies pulmonary toxicity medication exposure  INVESTIGATIONS:     HRCT April 2023  Narrative & Impression  CLINICAL DATA:   Interstitial lung disease   EXAM: CT CHEST WITHOUT CONTRAST  TECHNIQUE: Multidetector CT imaging of the chest was performed following the standard protocol without intravenous contrast. High resolution imaging of the lungs, as well as inspiratory and expiratory imaging, was performed.   RADIATION DOSE REDUCTION: This exam was performed according to the departmental dose-optimization program which includes automated exposure control, adjustment of the mA and/or kV according to patient size and/or use of iterative reconstruction technique.   COMPARISON:  Multiple prior exams, most recent lung cancer screening CT dated May 30, 2021   FINDINGS: Cardiovascular: Normal heart size. No pericardial effusion. Three-vessel coronary artery calcifications. Atherosclerotic disease of the thoracic aorta.   Mediastinum/Nodes: Esophagus and thyroid are unremarkable. Interval increased size of mediastinal hilar lymph nodes. Subcarinal lymph node measuring 1.2 cm in short axis on series 2, image 77, previously 1.1 cm. Right hilar lymph node measuring 1.4 cm in short axis on series 2, image 65, previously 1.3 cm.   Lungs/Pleura: Central airways are patent. No significant air trapping. Peripheral and peribronchovascular reticular and ground-glass opacities within upper lobe predominant distribution. Associated traction bronchiectasis and honeycomb change of the left upper lobe. Findings have progressed when compared with April 24, 2020 prior exam, particularly in the left upper lobe. Stable small bilateral solid pulmonary nodules. Largest is a solid nodule in the right upper lobe measuring 6 mm in mean diameter on series 5, image 74.   Upper Abdomen: Cholecystectomy clips.  No acute abnormality.   Musculoskeletal: No chest wall mass or suspicious bone lesions identified.   IMPRESSION: 1. Peripheral and peribronchovascular ground-glass and reticular opacities with an upper lobe  predominant distribution, favor smoking related interstitial lung disease. Findings have progressed when compared with April 24, 2020 exam. Findings are suggestive of an alternative diagnosis (not UIP) per consensus guidelines: Diagnosis of Idiopathic Pulmonary Fibrosis: An Official ATS/ERS/JRS/ALAT Clinical Practice Guideline. [place], Iss 5, [phone], Dec 14 2016. 2. Enlarged mediastinal and hilar lymph nodes. Potentially reactive, although nodes have increased in size when compared with most recent prior exam and prior exams dating back to April 24, 2020. Recommend PET-CT for further evaluation. 3. Aortic Atherosclerosis (ICD10-I70.0) and Emphysema (ICD10-J43.9).     Electronically Signed   By: Yetta Glassman M.D.   On: 07/31/2021 18:13           OV 11/30/2021  Subjective:  Patient ID: Joseph Green, male , DOB: Sep 04, 1945 , age 60 y.o. , MRN: [id number] , ADDRESS: [place] [id number] PCP Aura Dials, PA-C Patient Care Team: Selinda Orion as PCP - General (Physician Assistant)  This Provider for this visit: Treatment Team:  Attending Provider: Brand Males, MD    11/30/2021 -   Chief Complaint  Patient presents with   Follow-up    Pt states he has been doing okay since last visit. States he did quit smoking and has been wearing patches which has been giving him some problems.   Follow-up - Interstitial lung disease alternative to UIP pattern - Smoking - Associated mild pulmonary emphysema on CT chest - Lung cancer screening with nodules - Mediastinal/hilar adenopathy  HPI Joseph Green 76 y.o. -returns for follow-up.  Last visit in the spring 2023 I referred him to Dr. Roxan Hockey because he had progressive ILD that was not consistent with UIP.  He met with Dr. Roxan Hockey and declined biopsy.  He also has enlarged mediastinal adenopathy and hilar nodes.  The spring 2023 CT chest showed  increased size but he  did not want mediastinoscopy either based on my conversation with him.  Radiologist recommended a PET scan but he told me that he is got these lymph nodes for many years and he feels these are stable.  At this time he deferred PET scan.  We then discussed the need for antifibrotic's given progressive phenotype.  Discussed nintedanib and pirfenidone.  Discussed the side effects.  Based on the side effect profile he does not want to do this.  We discussed monitoring approach with pulmonary function test.  He declined pulmonary function test today because of anxiety but is willing to have it done again in the future.  We discussed serial CT chest.  He is open to this idea.  This will be done in the spring 2024 unless we get a PET scan for the mediastinal neuropathy sooner.  Of note he does have pulmonary emphysema but is not on any inhalers.  I have offered him a Spiriva MDI to see if this will help with symptoms.  In terms of his smoking he quit smoking earlier this month he is on a nicotine patch.  We discussed for 3 minutes Zyban and also Wellbutrin and the side effect profile and quit smoking strategies.  He is not interested.   SYMPTOM SCALE - ILD 08/23/2021 11/30/2021   Current weight    O2 use ra ra  Shortness of Breath 0 -> 5 scale with 5 being worst (score 6 If unable to do)   At rest 0 2  Simple tasks - showers, clothes change, eating, shaving 0 1  Household (dishes, doing bed, laundry) 2 2  Shopping 0 2  Walking level at own pace 1 2  Walking up Stairs 1 2  Total (30-36) Dyspnea Score 4 11      Non-dyspnea symptoms (0-> 5 scale) 08/23/2021 11/30/2021   How bad is your cough? 2 2  How bad is your fatigue 1 0  How bad is nausea 0 0  How bad is vomiting?  0 0  How bad is diarrhea? 00 0  How bad is anxiety? 0 4  How bad is depression 0 0  Any chronic pain - if so where and how bad 0 0      No results found. Simple office walk 185 feet x  3 laps goal with  forehead probe 08/23/2021    O2 used ra   Number laps completed 3   Comments about pace avg   Resting Pulse Ox/HR 100% and 64/min   Final Pulse Ox/HR 97% and 86/min   Desaturated </= 88% no   Desaturated <= 3% points yes   Got Tachycardic >/= 90/min no   Symptoms at end of test No complaints   Miscellaneous comments x      PFT     Latest Ref Rng & Units 07/02/2017    9:41 AM 06/03/2016    3:58 PM 11/23/2015    9:19 AM  PFT Results  FVC-Pre L 4.11  3.67  3.98   FVC-Predicted Pre % 81  72  77   FVC-Post L 3.72  3.77  4.25   FVC-Predicted Post % 73  73  83   Pre FEV1/FVC % % 77  78  78   Post FEV1/FCV % % 77  80  78   FEV1-Pre L 3.15  2.86  3.10   FEV1-Predicted Pre % 84  76  82   FEV1-Post L 2.86  3.00  3.33   DLCO uncorrected ml/min/mmHg 20.16  21.37  20.45   DLCO UNC% % 53  56  54   DLCO corrected ml/min/mmHg  21.19    DLCO COR %Predicted %  56    DLVA Predicted % 71  78  69     IMMUNOLOGY   Latest Reference Range & Units 08/23/21 09:27  Sed Rate 0 - 20 mm/hr 29 (H)  Anti Nuclear Antibody (ANA) NEGATIVE  NEGATIVE  Cyclic Citrullin Peptide Ab UNITS <16  RA Latex Turbid. <14 IU/mL <14  QUANTIFERON-TB GOLD PLUS  Rpt  Mitogen-NIL IU/mL >10.00  NIL IU/mL 1.02  TB1-NIL IU/mL <0.00  TB2-NIL IU/mL <0.00  (H): Data is abnormally high Rpt: View report in Results Review for more information   has a past medical history of Allergy, Anxiety, Emphysema of lung ([place]), Environmental allergies, Hypercholesteremia, Hypertension, and Personal history of colonic polyps (07/12/2008).   reports that he quit smoking about 2 weeks ago. His smoking use included cigarettes. He has a 20.50 pack-year smoking history. He has never used smokeless tobacco.  Past Surgical History:  Procedure Laterality Date   CHOLECYSTECTOMY  2010   [facility]   COLONOSCOPY W/ POLYPECTOMY  04/19/2015   w/Dr.Gessner   DOPPLER ECHOCARDIOGRAPHY  01/29/2018 at [facility]   EF 65-70%, Grade 1 mild diastolic  dysfunction/AV normal with no regurgitation. report sent to be scan in chart    Allergies  Allergen Reactions   Statins Other (See Comments)    lipitor - muscle pain    Lipitor [Atorvastatin] Other (See Comments)    Body aches    Immunization History  Administered Date(s) Administered   Fluad Quad(high Dose 65+) 01/22/2018, 12/27/2019, 02/05/2021   Hepatitis B, ped/adol 06/25/2012, 08/04/2012, 04/01/2013   Influenza Split 02/11/2012, 01/05/2013, 01/14/2015   Influenza, High Dose Seasonal PF 12/26/2015, 02/06/2017, 01/31/2019, 02/05/2021   Influenza, Seasonal, Injecte, Preservative Fre 01/26/2015, 02/06/2017   PFIZER(Purple Top)SARS-COV-2 Vaccination 05/07/2019, 05/28/2019, 02/09/2020   Pfizer Covid-19 Vaccine Bivalent Booster 16yrs & up 02/05/2021   Pneumococcal Conjugate-13 06/23/2014   Pneumococcal Polysaccharide-23 02/27/2012, 11/14/2014   Pneumococcal-Unspecified 11/14/2014   Tdap 06/01/2007    Family History  Problem Relation Age of Onset   Heart disease Father    Colon cancer Neg Hx    Colon polyps Neg Hx    Esophageal cancer Neg Hx    Rectal cancer Neg Hx    Stomach cancer Neg Hx      Current Outpatient Medications:    amLODipine (NORVASC) 10 MG tablet, Take 10 mg by mouth daily., Disp: , Rfl:    aspirin 81 MG tablet, Take 81 mg by mouth daily., Disp: , Rfl:    busPIRone (BUSPAR) 10 MG tablet, Take 10 mg by mouth 2 (two) times daily., Disp: , Rfl:    CVS VITAMIN B12 1000 MCG tablet, Take by mouth., Disp: , Rfl:    fexofenadine (ALLEGRA) 180 MG tablet, Take by mouth., Disp: , Rfl:    hydrOXYzine (ATARAX) 25 MG tablet, Take 1 tablet (25 mg total) by mouth every 6 (six) hours., Disp: 12 tablet, Rfl: 0   Omega-3 Fatty Acids (FISH OIL) 1000 MG CAPS, Take by mouth., Disp: , Rfl:    pantoprazole (PROTONIX) 40 MG tablet, Take by mouth., Disp: , Rfl:    rosuvastatin (CRESTOR) 5 MG tablet, Take 10 mg by mouth at bedtime. , Disp: , Rfl:    Tiotropium Bromide Monohydrate  (SPIRIVA RESPIMAT) 1.25 MCG/ACT AERS, Inhale 2 puffs into the lungs daily., Disp: 4 g, Rfl: 0      Objective:  Vitals:   11/30/21 1325  BP: 120/60  Pulse: 83  SpO2: 96%  Weight: 201 lb 12.8 oz (91.5 kg)  Height: $Remove'6\' 2"'TgrKxDU$  (1.88 m)    Estimated body mass index is 25.91 kg/m as calculated from the following:   Height as of this encounter: $RemoveBeforeD'6\' 2"'RTIaDIcifyCdKl$  (1.88 m).   Weight as of this encounter: 201 lb 12.8 oz (91.5 kg).  $Rem'@WEIGHTCHANGE'etDG$ @  [employer]   11/30/21 1325  Weight: 201 lb 12.8 oz (91.5 kg)     Physical Exam    General: No distress. Looks well Neuro: Alert and Oriented x 3. GCS 15. Speech normal Psych: Pleasant Resp:  Barrel Chest - no.  Wheeze - no, Crackles - no, No overt respiratory distress CVS: Normal heart sounds. Murmurs - no Ext: Stigmata of Connective Tissue Disease - no HEENT: Normal upper airway. PEERL +. No post nasal drip        Assessment:       ICD-10-CM   1. ILD (interstitial lung disease) (HCC)  J84.9 Pulmonary function test    2. Mediastinal adenopathy  R59.0     3. Quit smoking  Z87.891     4. Pulmonary emphysema, unspecified emphysema type ([place])  J43.9          Plan:     Patient Instructions  ILD (interstitial lung disease) ([place])  -We do not know what variety you have but suspect it might not be variety called IPF\ -Radiologist concerned it is progressive over time -Respect declining having lung biopsy after meeting Dr. Roxan Hockey in May 2023 -Respect declining to take antifibrotic's on the basis of progression -Noted that he did not do spirometry and DLCO this visit because of anxiety  Plan - Do spirometry and DLCO in 2-3 months -Simple walking desaturation test at follow-up in 2-3 months   Mediastinal adenopathy -chronic and noted but reported a slightly enlarged in April 2023 CT chest  Noted declining to have biopsy procedure after meeting Dr. Roxan Hockey May 2023  Plan  - Supportive shared decision making to have  clinical follow-up and not wanting PET scan at this visit   Quit smoking -August 2023  Congratulations.  Discussed quit smoking for 3 minutes  Plan - Continue nicotine patch - Respect declining Zyban and also Wellbutrin  Pulmonary emphysema, unspecified emphysema type ([place])   -This has been noticed on CT scan and it is mild  Plan - Try empiric Spiriva inhaler and see if this improves her shortness of breath  Follow-up - Return in 2-3 months but after breathing test    SIGNATURE    Dr. Brand Males, M.D., F.C.C.P,  Pulmonary and Critical Care Medicine Staff Physician, [place] Director - Interstitial Lung Disease  Program  [facility] [place] [facility] [place], [place], [address]  Pager: [phone], If no answer or between  15:00h - 7:00h: call [phone] Telephone: [phone]  2:00 PM 11/30/2021

## 2022-01-15 ENCOUNTER — Other Ambulatory Visit: Payer: Self-pay | Admitting: Otolaryngology

## 2022-01-15 DIAGNOSIS — R221 Localized swelling, mass and lump, neck: Secondary | ICD-10-CM

## 2022-02-07 ENCOUNTER — Other Ambulatory Visit: Payer: Medicare PPO

## 2022-02-13 DEATH — deceased

## 2023-06-19 ENCOUNTER — Encounter: Payer: Self-pay | Admitting: Internal Medicine

## 2023-07-15 ENCOUNTER — Encounter: Payer: Self-pay | Admitting: *Deleted
